# Patient Record
Sex: Female | Born: 1970 | Race: White | Marital: Married | Smoking: Former smoker
Health system: Northeastern US, Academic
[De-identification: ages and names within clinical notes are randomized; demographics above are authoritative.]

## PROBLEM LIST (undated history)

## (undated) DIAGNOSIS — F419 Anxiety disorder, unspecified: Secondary | ICD-10-CM

## (undated) DIAGNOSIS — F32A Depression, unspecified: Secondary | ICD-10-CM

## (undated) DIAGNOSIS — U071 COVID-19: Secondary | ICD-10-CM

## (undated) DIAGNOSIS — E119 Type 2 diabetes mellitus without complications: Secondary | ICD-10-CM

## (undated) DIAGNOSIS — G43909 Migraine, unspecified, not intractable, without status migrainosus: Secondary | ICD-10-CM

## (undated) HISTORY — DX: Depression, unspecified: F32.A

## (undated) HISTORY — DX: Anxiety disorder, unspecified: F41.9

## (undated) HISTORY — DX: Migraine, unspecified, not intractable, without status migrainosus: G43.909

## (undated) HISTORY — DX: COVID-19: U07.1

## (undated) HISTORY — PX: CARPAL TUNNEL RELEASE: SHX101

---

## 2001-10-22 ENCOUNTER — Encounter: Payer: Self-pay | Admitting: Gastroenterology

## 2007-10-12 ENCOUNTER — Encounter: Payer: Self-pay | Admitting: Gastroenterology

## 2009-01-09 DIAGNOSIS — Z331 Pregnant state, incidental: Secondary | ICD-10-CM | POA: Insufficient documentation

## 2009-10-21 ENCOUNTER — Ambulatory Visit: Payer: Self-pay | Admitting: Otolaryngology

## 2009-10-26 DIAGNOSIS — J342 Deviated nasal septum: Secondary | ICD-10-CM | POA: Insufficient documentation

## 2009-10-26 DIAGNOSIS — D1039 Benign neoplasm of other parts of mouth: Secondary | ICD-10-CM | POA: Insufficient documentation

## 2009-10-26 DIAGNOSIS — J343 Hypertrophy of nasal turbinates: Secondary | ICD-10-CM | POA: Insufficient documentation

## 2009-11-02 ENCOUNTER — Ambulatory Visit
Admit: 2009-11-02 | Discharge: 2009-11-02 | Disposition: A | Discharge status: Home / Self Care | Payer: Self-pay | Source: Ambulatory Visit | Attending: Otolaryngology | Admitting: Otolaryngology

## 2009-11-03 ENCOUNTER — Ambulatory Visit: Payer: Self-pay | Admitting: Otolaryngology

## 2009-11-04 LAB — SURGICAL PATHOLOGY

## 2009-11-09 ENCOUNTER — Ambulatory Visit: Payer: Self-pay | Admitting: Otolaryngology

## 2009-11-16 NOTE — Op Note (Signed)
SURGEON:  Emilio Aspen, MD  CO-SURGEON:  ASSISTANTHyman Bower, MD,RES  SURGERY DATE:  11/02/2009    PREOPERATIVE DIAGNOSIS:   1.    Deviated nasal septum and inferior  turbinate hypertrophy with obstructed nasal         airway.        2.    Uvular lesion.    POSTOPERATIVE DIAGNOSIS:  1.    Deviated nasal septum and inferior  turbinate hypertrophy with obstructed nasal         airway.        2.    Uvular lesion.    OPERATIVE PROCEDURE:      Septoplasty with inferior turbinate submucosal  resection and excision of uvular lesion.    ANESTHESIA: General.    ESTIMATED BLOOD LOSS:     150 cc.    DESCRIPTION OF PROCEDURE:              The patient was brought to the  operating room and induced using a general anesthetic.  After a surgical  timeout, neurosurgical pledgets soaked in 4% cocaine solution were inserted  into the nasal cavity for vasoconstriction and anesthesia.    A total of 9  cc of 1% lidocaine with epinephrine were then injected into the floor of  the nose, the columella, and the nasal septum.  The patient was draped in  the usual fashion for nasal surgery.  Nasal cavities were examined using  the nasal speculum.  The deformities of the septum were noted to be a  concavity on the right and convexity on the left that was obstructing the  cavity as well as inferior deflection off of the maxillary crest.    The  columellar retractor was placed and the anterior caudal aspect of the nasal  septal cartilage was delivered.   A 15-blade was used to make an incision  down to the cartilage while the alar retractor was used to protect the ala.  The proper plane was identified using the caudal knife and the  submucoperichondrial flap was raised on the left septum using the Market researcher.   A small linear rent was made high in the septum about halfway  back.   A vertical incision through the cartilage was made anterior to the  deflection and a submucoperichondrial flap was raised then on the patient's  right  side.   The deviated cartilage was then removed using open-biting  rongeurs and an osteotome was used to remove obstructing cartilage high  along the septum as well as along the inferior strip.   Nasal cavity was  then examined and found to be less obstructed than previously.   Attention  was turned to the hemitransfixion incision.  This was closed using 3-0  chromic suture and a quilting stitch was placed through the septal mucosa  and a small rent on the right inferiorly served as a drainage port.  Attention then turned to the inferior turbinates.  The turbinates were  injected with a total of 5 cc of 1% lidocaine with epinephrine.  A 15-blade  was used to make an incision down to bone along the inferior edge of the  turbinates and a caudal elevator was used to elevate a medial mucosal flap.  The open-biting rongeur was used to remove turbinate bone and lateral  mucosa.  The turbinates were also outfractured.   Bleeding was addressed  using the suction cautery.   Nasal splints were then placed along the  septum.  The Silastic splints were affixed with nylon suture.  Given the  bleeding from the inferior turbinates, Telfa rolls were inserted as nasal  packing as well as some Surgicel.  Attention then turned to the oral  cavity.   The McIvor mouth gag was inserted and the uvula was easily  identified.  The papillomatous lesion was grasped using forceps and  resected using Metzenbaum scissors .  This was sent to pathology.  There  was no bleeding from the excisional biopsy site and the mouth gag was  removed.  The patient's oropharynx and hypopharynx were suctioned and the  procedure was terminated.  Care was returned to the Anesthesia team and the  patient was extubated and brought to the PACU in good condition.   There  were no complications with the surgery.  Dr. Anne Shutter was present and active  through the entire case.    Dictated by:  Hyman Bower, MD,RES    I was present throughout the entire  procedure.    Electronically Signed and Finalized  by  Emilio Aspen, MD 11/16/2009 14:57  _____________________________________________  Emilio Aspen, MD      DD:   11/02/2009  DT:   11/02/2009  9:29 P  DVI:  [id number]  [id number]    cc:   Emilio Aspen, MD

## 2009-11-24 ENCOUNTER — Ambulatory Visit: Payer: Self-pay | Admitting: Otolaryngology

## 2010-07-06 ENCOUNTER — Ambulatory Visit
Admit: 2010-07-06 | Discharge: 2010-07-06 | Disposition: A | Discharge status: Home / Self Care | Payer: Self-pay | Source: Ambulatory Visit | Attending: Specialist | Admitting: Specialist

## 2010-07-13 ENCOUNTER — Encounter: Payer: Self-pay | Admitting: Gastroenterology

## 2010-07-19 ENCOUNTER — Ambulatory Visit
Admit: 2010-07-19 | Discharge: 2010-07-19 | Disposition: A | Discharge status: Home / Self Care | Payer: Self-pay | Source: Ambulatory Visit | Attending: Specialist | Admitting: Specialist

## 2010-07-19 LAB — LIPID PANEL
Chol/HDL Ratio: 4.8
Cholesterol: 186 mg/dL
HDL: 39 mg/dL
LDL Calculated: 133 mg/dL
Non HDL Cholesterol: 147 mg/dL
Triglycerides: 72 mg/dL

## 2010-07-19 LAB — GGT: GGT: 12 U/L (ref 5–36)

## 2010-07-19 LAB — COMPREHENSIVE METABOLIC PANEL
ALT: 11 U/L (ref 0–35)
AST: 12 U/L (ref 0–35)
Albumin: 4.3 g/dL (ref 3.5–5.2)
Alk Phos: 57 U/L (ref 35–105)
Anion Gap: 7 (ref 7–16)
Bilirubin,Total: 0.3 mg/dL (ref 0.0–1.2)
CO2: 28 mmol/L (ref 20–28)
Calcium: 9.5 mg/dL (ref 8.8–10.2)
Chloride: 105 mmol/L (ref 96–108)
Creatinine: 0.82 mg/dL (ref 0.51–0.95)
GFR,Black: >59 *
GFR,Caucasian: >59 *
Glucose: 95 mg/dL (ref 74–106)
Lab: 13 mg/dL (ref 6–20)
Potassium: 4.4 mmol/L (ref 3.3–5.1)
Sodium: 140 mmol/L (ref 133–145)
Total Protein: 6.4 g/dL (ref 6.3–7.7)

## 2010-07-19 LAB — CBC AND DIFFERENTIAL
Baso # K/uL: 0 THOU/uL (ref 0.0–0.1)
Basophil %: 0.5 % (ref 0.1–1.2)
Eos # K/uL: 0 THOU/uL (ref 0.0–0.4)
Eosinophil %: 0.5 % — ABNORMAL LOW (ref 0.7–5.8)
Hematocrit: 42 % (ref 34–45)
Hemoglobin: 14.2 g/dL (ref 11.2–15.7)
Lymph # K/uL: 2.7 THOU/uL (ref 1.2–3.7)
Lymphocyte %: 41.6 % (ref 19.3–51.7)
MCV: 92 fL (ref 79–95)
Mono # K/uL: 0.4 THOU/uL (ref 0.2–0.9)
Monocyte %: 5.7 % (ref 4.7–12.5)
Neut # K/uL: 3.3 THOU/uL (ref 1.6–6.1)
Platelets: 179 THOU/uL (ref 160–370)
RBC: 4.5 MIL/uL (ref 3.9–5.2)
RDW: 12.6 % (ref 11.7–14.4)
Seg Neut %: 51.7 % (ref 34.0–71.1)
WBC: 6.5 THOU/uL (ref 4.0–10.0)

## 2010-07-19 LAB — TSH: TSH: 0.94 u[IU]/mL (ref 0.27–4.20)

## 2010-07-19 LAB — T4, FREE: Free T4: 0.9 ng/dL (ref 0.9–1.7)

## 2010-07-20 LAB — HEMOGLOBIN A1C: Hemoglobin A1C: 5.5 % (ref 4.0–6.0)

## 2010-07-20 LAB — METHYLMALONIC ACID, SERUM: Methylmalonic Acid: <0.1 umol/L (ref 0.00–0.40)

## 2010-07-21 LAB — VITAMIN D
25-OH VIT D2: <4 ng/mL
25-OH VIT D3: 23 ng/mL
25-OH Vit Total: 23 ng/mL — ABNORMAL LOW (ref 30–80)

## 2010-09-06 ENCOUNTER — Other Ambulatory Visit: Payer: Self-pay | Admitting: Specialist

## 2010-09-06 ENCOUNTER — Encounter: Payer: Self-pay | Admitting: Gastroenterology

## 2010-09-06 ENCOUNTER — Ambulatory Visit: Payer: Self-pay | Admitting: Obstetrics and Gynecology

## 2010-09-06 DIAGNOSIS — Z1231 Encounter for screening mammogram for malignant neoplasm of breast: Secondary | ICD-10-CM

## 2010-09-08 NOTE — Progress Notes (Signed)
Reason For Visit   Sharon Valdez, a 39 year old G 42 P62 female presents for discussion of   removal of IUD.  HPI   Married one month ago.  Hx of TAB last year and Mirena IUD placement.  Now has joined church with husband and does not feel comfortable with IUD.  Husband is unaware that patient has IUD in and unaware of TAB hx.     Denies domestic violence, controlling behavior.     .  Allergies   Latex-asked/denied  No Known Drug Allergy.  Current Meds   ALPRAZolam 0.5 MG Tablet;take 1/2 or 1 tablet every 4 hours if needed   maximum daily dose of 4 tablets; RPT  Lexapro 10 MG Tablet;take 1 tablet by mouth once daily; RPT  Hydrocodone-Acetaminophen 5-500 MG Tablet;TAKE 1 TABLET EVERY 4 TO 6 HOURS   AS NEEDED FOR PAIN. NO MORE THAN 6 TABLETS PER DAY.; Rx  Fluconazole 150 MG Tablet;take 1 tablet by mouth FOR 1 DOSE. REPEAT AFTER   48 HOURS; RPT.  Assessment   39 yo  desires IUD removal  open to pregnancy - aware of AMA risks.  Plan   IUD easily removed   Discussed advanced maternal age related risks of pregnancy  Discussed details of natural family planning, barrier method, other   hormonal options available  Pt requests that if she does become pregant that GandP hx are not shared   with husband, and IUD use is not shared with husband.  Prenatal vitamins  RV for annual and sooner PRN.  Signature   Electronically signed by: Felipa Emory  CNM; 09/08/2010 7:09 PM EST; Chartered loss adjuster.

## 2010-09-13 ENCOUNTER — Ambulatory Visit
Admit: 2010-09-13 | Discharge: 2010-09-13 | Disposition: A | Discharge status: Home / Self Care | Payer: Self-pay | Source: Ambulatory Visit | Attending: Specialist | Admitting: Specialist

## 2010-09-13 LAB — HM MAMMOGRAPHY

## 2010-09-15 ENCOUNTER — Other Ambulatory Visit: Payer: Self-pay | Admitting: Radiology

## 2010-09-15 DIAGNOSIS — R928 Other abnormal and inconclusive findings on diagnostic imaging of breast: Secondary | ICD-10-CM

## 2010-09-20 ENCOUNTER — Ambulatory Visit
Admit: 2010-09-20 | Discharge: 2010-09-20 | Disposition: A | Discharge status: Home / Self Care | Payer: Self-pay | Source: Ambulatory Visit | Attending: Obstetrics and Gynecology | Admitting: Obstetrics and Gynecology

## 2010-10-04 ENCOUNTER — Ambulatory Visit: Payer: Self-pay | Admitting: Obstetrics and Gynecology

## 2010-10-04 LAB — HM PAP SMEAR

## 2010-10-05 LAB — N. GONORRHOEAE DNA AMPLIFICATION: N. gonorrhoeae DNA Amplification: 0

## 2010-10-05 LAB — CHLAMYDIA PLASMID DNA AMPLIFICATION: Chlamydia Plasmid DNA Amplification: 0

## 2010-10-08 LAB — GYN CYTOLOGY

## 2010-10-08 LAB — HPV DNA PROBE WITH CYTOLOGY: HPV Hybrid Capture: NEGATIVE

## 2010-10-12 ENCOUNTER — Ambulatory Visit: Payer: Self-pay

## 2010-10-12 ENCOUNTER — Other Ambulatory Visit: Payer: Self-pay | Admitting: Obstetrics and Gynecology

## 2010-10-13 ENCOUNTER — Ambulatory Visit
Admit: 2010-10-13 | Discharge: 2010-10-13 | Disposition: A | Discharge status: Home / Self Care | Payer: Self-pay | Source: Ambulatory Visit | Attending: Obstetrics and Gynecology | Admitting: Obstetrics and Gynecology

## 2010-10-13 LAB — TSH: TSH: 1.02 u[IU]/mL (ref 0.27–4.20)

## 2010-10-13 LAB — T4, FREE: Free T4: 0.9 ng/dL (ref 0.9–1.7)

## 2010-10-13 LAB — T3: T3: 118 ng/dL (ref 80–200)

## 2010-10-13 NOTE — Progress Notes (Signed)
 Reason For Visit   Sharon Valdez, a 39 year old G 3 P44 female presents for her annual Gyn   exam.  HPI   Has not had menses since IUD removal on 09/06/10.  Hoping will get this month so can try for pregnancy with new husband.     Reports has gained 18 #s since stopped working out in July.     .  Allergies   Latex-asked/denied  No Known Drug Allergy.  Current Meds   Neomycin-Polymyxin B GU SOLN;; RPT  1-Medication Reconciliation;Done today.; Rx  Lexapro 10 MG Tablet;take 1 tablet by mouth once daily; RPT  ALPRAZolam 0.5 MG Tablet;take 1/2 or 1 tablet every 4 hours if needed   maximum daily dose of 4 tablets; RPT.  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  PSH   Neuroplasty Median Nerve At Carpal Tunnel.  Fatty cyst removed from left flank.  Family Hx   Sororal history of Depression Chronic; and mother  Maternal history of Diabetes Mellitus.  Personal Hx   Alcohol: No   Illicit Drugs: None  Tobacco:   Yes5cigarettes per day       SOCIAL HISTORY  Marital Status: married  Lives with husband and 2 children (1 bio 1 adopted)  Employed: Yes - at [employer] 4 10-11 hr days  History of Domestic Violence No  Feels safe in Current Relationship Yes  Depression Symptoms: Yes         HEALTHY PRACTICES  Self Breast Exam: Yes - recent mamo with [place]  Balanced Diet: Yes - eats late at night  Exercise: No  Vitamin-Supplements Yes  Seat belt: Yes  Gun in the home: No  Safe sex practices: Yes  Health Care Proxy on file: Yes      NUTRITION:   recent 18 # wt gain     FUNCTIONAL/COGNITIVE ABILITY:  --No functional or cognitive limitations.  Barriers to Education   BARRIERS TO EDUCATION                                                                                                                                                                                Assessment of learning needs:  10/04/2010       No barriers to learning      Reads/writes English     .  ROS   CONSTITUTIONAL: Appetite good, no fevers, night sweats or weight loss  EYES: No visual changes, no eye pain  ENT: No hearing difficulties, no ear pain  CV: No chest pain, shortness of breath or peripheral edema  RESPIRATORY: No cough, wheezing or dyspnea  GI: No nausea/vomiting, abdominal pain, or change in bowel habits  GU: No dysuria, urgency or incontinence  MS: No joint pain/swelling or musculoskeletal deformities  SKIN: No rashes  NEURO: No MS changes, no motor weakness, no sensory changes  PSYCH: No depression or anxiety  ENDOCRINE: No polyuria/polydipsia, no heat intolerance  HEME/LYMPH: No easy bleeding/bruising or swollen nodes  ALL/IMMUN: No allergic reactions  .  Vital Signs   Recorded by [facility] on 04 Oct 2010 10:02 AM  BP:122/78,   Height: 62.5 in, Weight: 167 lb, BMI: 30.1 kg/m2,   Pain Scale: 0.  Physical Exam   GENERAL: 39 year old female in NAD.    MENTAL STATUS: Alert, normal MS. Answers all questions appropriately.  NECK: Thyroid without obvious nodules or enlargement. No lymphadenopathy.   SKIN: No rashes, no nevi.  LUNGS: CTA bilaterally, no wheezes with forced expiration, respirations   unlabored.  HEART: Regular rate without murmurs.  BREASTS: Symmetrical bilaterally. No palpable masses or nipple discharge.   No skin changes. No axillary or clavicular adenopathy. Nipples are everted.  ABDOMEN:Soft, non-tender, no HSM, no palpable masses, scars, or lesions.  PELVIC EXAM:  EXT. GENITALIA: Urethral meatus and vulva normal in appearance  INTERNAL GENITALIA: Negative BSU, pink, rugatted, normal appearing discharge  CERVIX: Midline, pink. os closed, no discharge, no CMT, no lesions  VAGINA: Smooth pink walls, no lesions,  no discharge.  UTERUS:  Anteverted,no palpable masses, nontender  ADNEXA: no mass, non tender   RECTAL EXAM: Deferred.  Assessment   39 yo   desires pregnancy  amenorrhea since IUD removed    month ago.  Plan   Lab slip TSH, Free T4, T3  D3  1000 mg daily  continue prenatal vits  Exercise daily for wt loss  Pap with HPV sent  GC/CT sent  RV in 2-3  months if no menses  .  Signature   Electronically signed by: Felipa Emory  CNM; 10/13/2010 7:26 PM EST; Chartered loss adjuster.

## 2010-12-28 NOTE — Miscellaneous (Unsigned)
 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 ; Rx   ALPRAZolam 0.5 MG Tablet; take 1/2 or 1 tablet every 4 hours if needed   maximum daily dose of 4 tablets ; RPT   Lexapro 10 MG Tablet; take 1 tablet by mouth once daily ; RPT   Prenatal Vitamins TABS ; RPT   Vitamin D3 1000 UNIT Tablet; TAKE 1 TABLET DAILY. ; RPT     Immunizations  H1N1 Influenza Inj   Influenza

## 2012-01-01 ENCOUNTER — Encounter: Payer: Self-pay | Admitting: Emergency Medicine

## 2012-01-01 ENCOUNTER — Emergency Department
Admission: EM | Admit: 2012-01-01 | Disposition: A | Discharge status: Home / Self Care | Payer: Self-pay | Source: Ambulatory Visit | Attending: Emergency Medicine | Admitting: Emergency Medicine

## 2012-01-01 MED ORDER — OSELTAMIVIR PHOSPHATE 75 MG PO CAPS *I*
75.0000 mg | ORAL_CAPSULE | Freq: Once | ORAL | Status: AC
Start: 2012-01-01 — End: 2012-01-01
  Administered 2012-01-01: 75 mg via ORAL
  Filled 2012-01-01: qty 1

## 2012-01-01 MED ORDER — OXYCODONE-ACETAMINOPHEN 5-325 MG PO TABS *I*
2.0000 | ORAL_TABLET | Freq: Once | ORAL | Status: AC
Start: 2012-01-01 — End: 2012-01-01

## 2012-01-01 MED ORDER — OXYCODONE-ACETAMINOPHEN 5-325 MG PO TABS *I*
ORAL_TABLET | ORAL | Status: AC
Start: 2012-01-01 — End: 2012-01-01
  Administered 2012-01-01: 2 via ORAL
  Filled 2012-01-01: qty 2

## 2012-01-01 MED ORDER — OSELTAMIVIR PHOSPHATE 75 MG PO CAPS *I*
75.0000 mg | ORAL_CAPSULE | Freq: Two times a day (BID) | ORAL | Status: AC
Start: 2012-01-01 — End: 2012-01-06

## 2012-01-01 MED ORDER — OXYCODONE-ACETAMINOPHEN 5-325 MG PO TABS *I*
1.0000 | ORAL_TABLET | ORAL | Status: AC | PRN
Start: 2012-01-01 — End: 2012-01-06

## 2012-01-01 MED ORDER — BENZONATATE 100 MG PO CAPS *I*
100.0000 mg | ORAL_CAPSULE | Freq: Three times a day (TID) | ORAL | Status: AC | PRN
Start: 2012-01-01 — End: 2012-01-06

## 2013-03-19 ENCOUNTER — Ambulatory Visit: Payer: Self-pay | Admitting: Obstetrics and Gynecology

## 2013-03-19 ENCOUNTER — Encounter: Payer: Self-pay | Admitting: Obstetrics and Gynecology

## 2013-03-19 VITALS — BP 110/70 | Ht 63.5 in | Wt 182.0 lb

## 2013-03-19 DIAGNOSIS — Z01419 Encounter for gynecological examination (general) (routine) without abnormal findings: Secondary | ICD-10-CM

## 2013-03-19 DIAGNOSIS — N951 Menopausal and female climacteric states: Secondary | ICD-10-CM

## 2013-03-19 MED ORDER — ESTRADIOL-NORETHINDRONE ACET 0.05-0.14 MG/DAY TD PTTW *A*
1.0000 | MEDICATED_PATCH | TRANSDERMAL | Status: DC
Start: 2013-03-21 — End: 2013-07-12

## 2013-03-19 NOTE — Progress Notes (Signed)
GYNECOLOGY ANNUAL EXAM    [id number] GYN appointment     HPI: 42 y.o. G71P1001 Caucasian female presents to office today for annual exam.      Reports no menses for 8 months. Believes is menopausal because of daily hot flashes.  Reports gets them 5 x day and at night.  Also general trouble sleeping  Requesting medication to relieve these symptoms.    Has appt with PCP and having blood drawn for thyroid and full physical.    PAST MEDICAL HISTORY  Past Medical History   Diagnosis Date   . Depression    . Anxiety         PAST SURGICAL HISTORY  Past Surgical History   Procedure Laterality Date   . Carpal tunnel release       Neuroplasty Median Nerve At Carpal Tunnel   . Nasal septum surgery     . Fatty cyst  2007     left upper abdomen - skin        HOME MEDICATIONS  Prior to Admission medications    Medication Sig Start Date End Date Taking? Authorizing Provider   naproxen (NAPROSYN) 250 MG tablet Take 250 mg by mouth 2 times daily (with meals)   Yes [provider]   LEXAPRO 10 MG tablet take 1 tablet by mouth once daily 12/05/08  Yes Provider, Conversion   estradiol-norethindrone (COMBIPATCH) 0.05-0.14 MG/DAY Place 1 patch onto the skin twice a week 03/21/13   Emeline Darling, CNM        ALLERGIES  Allergies   Allergen Reactions   . No Known Drug Allergy      Created by Conversion - 0;    . No Known Latex Allergy      Created by Conversion - 0;         OBSTETRIC HISTORY  OB History    Grav Para Term Preterm Abortions TAB SAB Ect Mult Living    1 1 1       1        Obstetric Comments    GYN Hx:  Patient's last menstrual period was 04/18/2012.   Menarche: 42yo, Menses:  monthly    Last pap smear: 10/04/2010, Results: neg, HPV: neg    The patient is sexually active, +monog.     No STD hx          SOCIAL HISTORY  History     Social History   . Marital Status: Married     Spouse Name: Caryn Bee     Number of Children: 2   . Years of Education: N/A     Occupational History   . service coordinator      full time     Social  History Main Topics   . Smoking status: Former Smoker -- 0.50 packs/day     Quit date: 06/18/2012   . Smokeless tobacco: None   . Alcohol Use: No   . Drug Use: None   . Sexually Active: Yes -- Female partner(s)     Other Topics Concern   . None     Social History Narrative    Psychosocial Update        Lives with husband and 2 children    Partner name Caryn Bee, sons are 30 and 28 yo        Domestic violence:No    Abuse (physical, sexual, mental):  no    Depression symptoms:  Yes - takes lexapro  Occupation:  Full time Company secretary     Years of Education: 16        Life stressors:  Working mother...    Family relationships:  supportive        Exercise: 30 min day cardio, [employer] people                       SCREENING HISTORY  Mammogram yes      FAMILY HISTORY  Family History   Problem Relation Age of Onset   . Depression Mother    . Diabetes          REVIEW OF SYSTEMS  CONSTITUTIONAL: Appetite good, no fevers, +night sweats as per HPI  EYES: No visual changes, no eye pain  ENT: No hearing difficulties, no ear pain  CV: No chest pain, shortness of breath or peripheral edema  RESPIRATORY: No cough, wheezing or dyspnea  GI: No nausea/vomiting, abdominal pain, or change in bowel habits  GU: No dysuria, urgency or incontinence  MS: No joint pain/swelling or musculoskeletal deformities  SKIN: No rashes  NEURO: No MS changes, no motor weakness, no sensory changes  PSYCH: No depression or anxiety  ENDOCRINE: No polyuria/polydipsia, no heat intolerance  HEME/LYMPH: No easy bleeding/bruising or swollen nodes  ALL/IMMUN: No allergic reactions        PHYSICAL EXAM  Filed Vitals:    03/19/13 1406   BP: 110/70   Height: 5' 3.5" (1.613 m)   Weight: 182 lb (82.555 kg)     GENERAL: 42 y.o.  year y/o female in NAD.  HEENT: Neck supple, EOMI, thyroid without obvious nodules or enlargement. No lymphadenopathy.   LUNGS: CTA bilaterally, no wheezes with forced expiration, respirations unlabored.  HEART: Regular rate without  murmurs.  BREASTS: No palpable masses or nipple discharge. No skin changes. No axillary or clavicular adenopathy.  ABDOMEN: Soft, non-tender, no HSM, no palpable masses, scars, or lesions.    PELVIC: Normal external female genitalia, labia majora, minora, clitoris. BUS WNL.  Normal appearing cervix. Uterus is midline, anteverted, mobile, and non-tender. There is no CMT. Ovaries are WNL. Adnexa negative for masses.  EXTREMITIES: no edema.  SKIN: No rashes, no evidence of skin breakdown, no suspicious nevi.  NEURO: gait normal  MENTAL STATUS: Alert, normal MS. Answers all questions appropriately.      ASSESSMENT AND PLAN  42 y.o. G1P1001 AGY exam  Perimenopausal - discussed that dx of menopause is made after 12 months without period  Desires HRT for vasomotor sx: Rx combipatch with 2 refills  RV 2 months for follow up    PLAN   GYN SCREENING:   - Pap smear due 2016  Offered and declines:  - GC  - Chlamydia  - HIV  - RPR  - Hepatitis C   - Hepatitis B surface antibody and antigen     PREVENTATIVE HEALTH:  - Self breast awareness   - Calcium and Vitamin D3  - Multivitamins   - Mammogram yearly    CONTRACEPTION REVIEWED: Discussed that pregnancy is still possible, not probable  - Risk and prevention of STD reviewed.   - Contraceptive plan and follow-up discussed and understood by patient.     NUTRITION REVIEWED:   - Weight Loss Counseling  - Weight Management Strategies     EXERCISE:   - Counseled on regular exercise.    - Discussed duration, frequency, intensity, and types of exercise.    SAFER SEX PRACTICES:    -  Reviewed    RV 2 months and PRN  Warnings about risks with HRT  Handout on HRT given to patient.

## 2013-03-25 LAB — HM MAMMOGRAPHY: HM MAMMO: NEGATIVE

## 2013-05-20 ENCOUNTER — Ambulatory Visit: Payer: Self-pay | Admitting: Obstetrics and Gynecology

## 2013-05-27 LAB — HM PAP SMEAR: HM PAP: NORMAL

## 2013-06-11 ENCOUNTER — Ambulatory Visit: Payer: Self-pay | Admitting: Obstetrics and Gynecology

## 2013-07-02 ENCOUNTER — Other Ambulatory Visit: Payer: Self-pay | Admitting: Primary Care

## 2013-07-02 NOTE — Telephone Encounter (Signed)
SHE IS OUT OF MEDICINE. PLEASE REFILL TODAY.

## 2013-07-08 ENCOUNTER — Encounter: Payer: Self-pay | Admitting: Primary Care

## 2013-07-08 DIAGNOSIS — Z9889 Other specified postprocedural states: Secondary | ICD-10-CM | POA: Insufficient documentation

## 2013-07-08 DIAGNOSIS — F418 Other specified anxiety disorders: Secondary | ICD-10-CM | POA: Insufficient documentation

## 2013-07-09 ENCOUNTER — Encounter: Payer: Self-pay | Admitting: Primary Care

## 2013-07-09 ENCOUNTER — Ambulatory Visit: Payer: Self-pay | Admitting: Primary Care

## 2013-07-09 VITALS — BP 124/90 | HR 76 | Ht 62.5 in | Wt 185.0 lb

## 2013-07-09 MED ORDER — ESCITALOPRAM OXALATE 5 MG PO TABS *I*
5.0000 mg | ORAL_TABLET | Freq: Every day | ORAL | Status: DC
Start: 2013-07-09 — End: 2014-01-09

## 2013-07-09 MED ORDER — LEXAPRO 10 MG PO TABS
ORAL_TABLET | ORAL | Status: DC
Start: 2013-07-02 — End: 2013-08-13

## 2013-07-09 NOTE — Patient Instructions (Signed)
Can take Aleve twice daily with food for 7-10 days. Avoid straining right hip.  Bloodwork is ordered--please fast prior to obtaining at lab.

## 2013-07-09 NOTE — Progress Notes (Signed)
Patient ID: MILICA MOHRBACHER is a 42 y.o. year old female.    Chief Complaint   Patient presents with   . Medication Management     HPI:  Kathlene November is here to followup on history of depression and anxiety.  She is in need of refills on her medications.  Overall doing very well.  She did make an attempt at discontinuing her medication but found that she does in fact require at least a small dose of Lexapro.  She is currently cutting the Lexapro in half.  And this appears quite adequate in controlling in her symptoms.  She is also complaining of chronic fatigue.  Says she sleeps well.  No new drugs, alcohol.    Allergy / Social History / Medications:  Allergies   Allergen Reactions   . No Known Drug Allergy      Created by Conversion - 0;    . No Known Latex Allergy      Created by Conversion - 0;      History   Substance Use Topics   . Smoking status: Former Smoker -- 0.50 packs/day     Quit date: 06/18/2012   . Smokeless tobacco: Not on file   . Alcohol Use: No     Patient's Medications   New Prescriptions    No medications on file   Previous Medications    DIPHENHYDRAMINE (BENADRYL) 25 MG TABLET    Take 50 mg by mouth nightly    ESTRADIOL-NORETHINDRONE (COMBIPATCH) 0.05-0.14 MG/DAY    Place 1 patch onto the skin twice a week   Modified Medications    Modified Medication Previous Medication    ESCITALOPRAM (LEXAPRO) 5 MG TABLET LEXAPRO 10 MG tablet       Take 1 tablet (5 mg total) by mouth daily    take 1 tablet by mouth once daily    LEXAPRO 10 MG TABLET LEXAPRO 10 MG tablet       take 1 tablet by mouth once daily    take 1 tablet by mouth once daily   Discontinued Medications    ESCITALOPRAM (LEXAPRO) 10 MG TABLET    Take 5 mg by mouth    NAPROXEN (NAPROSYN) 250 MG TABLET    Take 250 mg by mouth 2 times daily (with meals)          Physical Exam:  Filed Vitals:    07/09/13 1128   BP: 124/90   Pulse: 76   Height: 1.588 m (5' 2.5")   Weight: 83.915 kg (185 lb)     SpO2 Readings from Last 3 Encounters:   01/01/12 99%    11/03/09 95%   10/21/09 98%     Estimated body mass index is 33.28 kg/(m^2) as calculated from the following:    Height as of this encounter: 1.588 m (5' 2.5").    Weight as of this encounter: 83.915 kg (185 lb).    Chest: Clear to auscultation.  Cardiac: Regular rate and rhythm no rubs murmurs or gallops.  Extremities: No edema     Recent Lab Results:  Lab Results   Component Value Date    NA 140 07/19/2010    K 4.4 07/19/2010    CL 105 07/19/2010    CO2 28 07/19/2010    UN 13 07/19/2010    CREAT 0.82 07/19/2010    VID25 23* 07/19/2010    WBC 6.5 07/19/2010    HGB 14.2 07/19/2010    HCT 42 07/19/2010    PLT 179  07/19/2010    TSH 1.02 10/13/2010    CHOL 186 07/19/2010    TRIG 72 07/19/2010    HDL 39 07/19/2010    LDLC 133 07/19/2010    CHHDC 4.8 07/19/2010     Hemoglobin A1C   Date Value Range Status   07/19/2010 5.5  4.0-6.0 % Final                                    NON-DIABETIC          Assessment and Plan:     1.Depression/anxiety.  Stable on current medication will have her continue the same and have her followup in 6-12 months.   2.Fatigue.  We will order lab work listed below in anticipation of a complete physical exam.          Orders this visit  Orders Placed This Encounter   Procedures   . Lipid panel   . Comprehensive metabolic panel   . CBC and differential   . Folate   . Hemoglobin A1c   . TSH   . T4, free   . Vitamin B12   . Vitamin d 25 hydroxy, d2&d3

## 2013-07-12 ENCOUNTER — Other Ambulatory Visit: Payer: Self-pay | Admitting: Obstetrics and Gynecology

## 2013-07-12 NOTE — Telephone Encounter (Signed)
edi-script request for combipatch.  Pt has an appt 8/20.

## 2013-08-05 ENCOUNTER — Ambulatory Visit
Admit: 2013-08-05 | Discharge: 2013-08-05 | Disposition: A | Discharge status: Home / Self Care | Payer: Self-pay | Source: Ambulatory Visit | Attending: Primary Care | Admitting: Primary Care

## 2013-08-05 DIAGNOSIS — F418 Other specified anxiety disorders: Secondary | ICD-10-CM

## 2013-08-05 DIAGNOSIS — R5383 Other fatigue: Secondary | ICD-10-CM

## 2013-08-05 LAB — CBC AND DIFFERENTIAL
Baso # K/uL: 0 10*3/uL (ref 0.0–0.1)
Basophil %: 0.3 % (ref 0.1–1.2)
Eos # K/uL: 0 10*3/uL (ref 0.0–0.4)
Eosinophil %: 0.6 % — ABNORMAL LOW (ref 0.7–5.8)
Hematocrit: 40 % (ref 34–45)
Hemoglobin: 13.5 g/dL (ref 11.2–15.7)
Lymph # K/uL: 2.9 10*3/uL (ref 1.2–3.7)
Lymphocyte %: 42.8 % (ref 19.3–51.7)
MCV: 90 fL (ref 79–95)
Mono # K/uL: 0.4 10*3/uL (ref 0.2–0.9)
Monocyte %: 6.4 % (ref 4.7–12.5)
Neut # K/uL: 3.4 10*3/uL (ref 1.6–6.1)
Platelets: 190 10*3/uL (ref 160–370)
RBC: 4.4 MIL/uL (ref 3.9–5.2)
RDW: 12.8 % (ref 11.7–14.4)
Seg Neut %: 49.9 % (ref 34.0–71.1)
WBC: 6.8 10*3/uL (ref 4.0–10.0)

## 2013-08-05 LAB — T4, FREE: Free T4: 0.7 ng/dL — ABNORMAL LOW (ref 0.9–1.7)

## 2013-08-05 LAB — COMPREHENSIVE METABOLIC PANEL
ALT: 21 U/L (ref 0–35)
AST: 19 U/L (ref 0–35)
Albumin: 4.2 g/dL (ref 3.5–5.2)
Alk Phos: 82 U/L (ref 35–105)
Anion Gap: 13 (ref 7–16)
Bilirubin,Total: 0.3 mg/dL (ref 0.0–1.2)
CO2: 25 mmol/L (ref 20–28)
Calcium: 9 mg/dL (ref 8.8–10.2)
Chloride: 104 mmol/L (ref 96–108)
Creatinine: 0.88 mg/dL (ref 0.51–0.95)
GFR,Black: 92 *
GFR,Caucasian: 80 *
Glucose: 103 mg/dL — ABNORMAL HIGH (ref 60–99)
Lab: 19 mg/dL (ref 6–20)
Potassium: 4.6 mmol/L (ref 3.3–5.1)
Sodium: 142 mmol/L (ref 133–145)
Total Protein: 6.4 g/dL (ref 6.3–7.7)

## 2013-08-05 LAB — LIPID PANEL
Chol/HDL Ratio: 5.1
Cholesterol: 229 mg/dL — AB
HDL: 45 mg/dL
LDL Calculated: 162 mg/dL
Non HDL Cholesterol: 184 mg/dL
Triglycerides: 110 mg/dL

## 2013-08-05 LAB — VITAMIN B12: Vitamin B12: 240 pg/mL (ref 211–946)

## 2013-08-05 LAB — HEMOGLOBIN A1C: Hemoglobin A1C: 5.5 % (ref 4.0–6.0)

## 2013-08-05 LAB — TSH: TSH: 2.45 u[IU]/mL (ref 0.27–4.20)

## 2013-08-05 LAB — FOLATE: Folate: 8.9 ng/mL (ref >=4.6)

## 2013-08-06 ENCOUNTER — Encounter: Payer: Self-pay | Admitting: Primary Care

## 2013-08-07 ENCOUNTER — Ambulatory Visit: Payer: Self-pay | Admitting: Obstetrics and Gynecology

## 2013-08-07 ENCOUNTER — Encounter: Payer: Self-pay | Admitting: Obstetrics and Gynecology

## 2013-08-07 ENCOUNTER — Ambulatory Visit: Payer: Self-pay | Admitting: Primary Care

## 2013-08-07 ENCOUNTER — Telehealth: Payer: Self-pay | Admitting: Primary Care

## 2013-08-07 VITALS — BP 114/78 | Ht 62.5 in | Wt 189.0 lb

## 2013-08-07 DIAGNOSIS — N951 Menopausal and female climacteric states: Secondary | ICD-10-CM

## 2013-08-07 DIAGNOSIS — Z79890 Hormone replacement therapy (postmenopausal): Secondary | ICD-10-CM

## 2013-08-07 LAB — VITAMIN D
25-OH VIT D2: <4 ng/mL
25-OH VIT D3: 21 ng/mL
25-OH Vit Total: 21 ng/mL — ABNORMAL LOW (ref 30–60)

## 2013-08-07 MED ORDER — ESTRADIOL-NORETHINDRONE ACET 0.05-0.14 MG/DAY TD PTTW *A*
1.0000 | MEDICATED_PATCH | TRANSDERMAL | Status: AC
Start: 2013-08-08 — End: ?

## 2013-08-07 NOTE — Telephone Encounter (Signed)
Message copied by Randall An on Wed Aug 07, 2013  4:47 PM  ------       Message from: Drue Second       Created: Wed Aug 07, 2013 12:13 PM         Please call patient. LDL cholesterol is elevated.She'll likely need medication. Please ask her to come in to discuss. B12 and Vit D also both low.  ------

## 2013-08-07 NOTE — Telephone Encounter (Signed)
Pt out of town.  She has appt 8/26

## 2013-08-07 NOTE — Telephone Encounter (Signed)
Message copied by Randall An on Wed Aug 07, 2013  4:57 PM  ------       Message from: Drue Second       Created: Wed Aug 07, 2013 12:13 PM         Please call patient. LDL cholesterol is elevated.She'll likely need medication. Please ask her to come in to discuss. B12 and Vit D also both low.  ------

## 2013-08-07 NOTE — Patient Instructions (Signed)
Vit D 3 1000 international unit daily    And Vitamin B12 supplementation

## 2013-08-07 NOTE — Progress Notes (Signed)
Sharon Valdez is a 42 y.o. female here for follow up after starting combipatch for perimenopausal sx.      HPI: LMP was June 2013  Reports no bleeding since.  Has been using combipatch - loves it - has helped with hot flashes.     Has continued gaining weight gain - doing "[employer]" on line diet and exercise program    PCP ordered blood work but pt was unable to be seen today because was late for appt.   Pt has rescheduled.     Gynecologic History:  Patient's last menstrual period was 04/18/2012.       OB History    Grav Para Term Preterm Abortions TAB SAB Ect Mult Living    1 1 1       1        Obstetric Comments    GYN Hx:  Patient's last menstrual period was 04/18/2012.   Menarche: 42yo, Menses:  monthly    Last pap smear: 10/04/2010, Results: neg, HPV: neg    The patient is sexually active, +monog.     No STD hx          Patient Active Problem List   Diagnosis Code   . Pregnancy V22.2   . Deviated Nasal Septum (Acquired) 470   . Hypertrophied Nasal Turbinate 478.0   . 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 S/P carpal tunnel release V45.89   . H/O removal of cyst V15.29   . S/P nasal septoplasty V45.89   . Depression with anxiety 300.4       Allergies   Allergen Reactions   . No Known Drug Allergy      Created by Conversion - 0;    . No Known Latex Allergy      Created by Conversion - 0;        Current Outpatient Prescriptions   Medication   . [START ON 08/08/2013] estradiol-norethindrone (COMBIPATCH) 0.05-0.14 MG/DAY   . LEXAPRO 10 MG tablet   . diphenhydrAMINE (BENADRYL) 25 MG tablet   . escitalopram (LEXAPRO) 5 MG tablet     No current facility-administered medications for this visit.       ROS:  CONSTITUTIONAL: Appetite good, no fevers, night sweats or weight loss  EYES: No visual changes, no eye pain  ENT: No hearing difficulties, no ear pain  CV: No chest pain, shortness of breath or peripheral edema  RESPIRATORY: No cough, wheezing or dyspnea  GI: No nausea/vomiting, abdominal pain, or change in bowel  habits  GU: No dysuria, urgency or incontinence  MS: No joint pain/swelling or musculoskeletal deformities  SKIN: No rashes  NEURO: No MS changes, no motor weakness, no sensory changes  PSYCH: No depression or anxiety  ENDOCRINE: No polyuria/polydipsia, no heat intolerance  HEME/LYMPH: No easy bleeding/bruising or swollen nodes  ALL/IMMUN: No allergic reactions    Objective:  BP 114/78  Ht 1.588 m (5' 2.5")  Wt 85.73 kg (189 lb)  BMI 34 kg/m2  LMP 04/18/2012     Objective:   GENERAL:   42 y.o.  year old female in NAD.    MENTAL STATUS: Alert, normal MS. Answers all questions appropriately.      Assessment & Plan:  42 yo G1P1 and one adopted child  + menopausal status - doing well with combipatch  Rx renewed for combipatch  Follow up with PCP for labs, cholesterol, Thyroid  RV annual and PRN    Time spent with patient: 20 minutes, > 50%  of time spent counseling

## 2013-08-13 ENCOUNTER — Ambulatory Visit: Payer: Self-pay | Admitting: Primary Care

## 2013-08-13 VITALS — BP 108/76 | HR 76 | Ht 62.0 in | Wt 190.0 lb

## 2013-08-13 DIAGNOSIS — E538 Deficiency of other specified B group vitamins: Secondary | ICD-10-CM

## 2013-08-13 DIAGNOSIS — E785 Hyperlipidemia, unspecified: Secondary | ICD-10-CM

## 2013-08-13 DIAGNOSIS — E559 Vitamin D deficiency, unspecified: Secondary | ICD-10-CM

## 2013-08-15 NOTE — Progress Notes (Signed)
Subjective: Sharon Valdez is a 42 y.o. female here to followup on hyperlipidemia and depression.  Patient did have lab work done on 8/18 which revealed total cholesterol 229, triglycerides 110, HDL 45 and LDL 162.  Her vitamin B12 and vitamin D were also noted to be low.    With regard to her depression, she's been quite stable feels well and denies side effects.  There's been no suicidal ideation.  There is mild anxiety but this appears to be mostly well controlled.        Current Outpatient Prescriptions   Medication Sig   . cholecalciferol (VITAMIN D) 1000 UNIT tablet Take 2,000 Units by mouth daily   . cyanocobalamin 250 MCG tablet Take 250 mcg by mouth daily   . estradiol-norethindrone (COMBIPATCH) 0.05-0.14 MG/DAY Place 1 patch onto the skin twice a week   . diphenhydrAMINE (BENADRYL) 25 MG tablet Take 50 mg by mouth nightly   . escitalopram (LEXAPRO) 5 MG tablet Take 1 tablet (5 mg total) by mouth daily       Allergies   Allergen Reactions   . No Known Drug Allergy      Created by Conversion - 0;    . No Known Latex Allergy      Created by Conversion - 0;      Patient Active Problem List   Diagnosis Code   . Pregnancy V22.2   . Deviated Nasal Septum (Acquired) 470   . Hypertrophied Nasal Turbinate 478.0   . 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 S/P carpal tunnel release V45.89   . H/O removal of cyst V15.29   . S/P nasal septoplasty V45.89   . Depression with anxiety 300.4     Past Medical History   Diagnosis Date   . Depression    . Anxiety      Past Surgical History   Procedure Laterality Date   . Carpal tunnel release       Neuroplasty Median Nerve At Carpal Tunnel   . Nasal septum surgery     . Fatty cyst  2007     left upper abdomen - skin      History   Substance Use Topics   . Smoking status: Former Smoker -- 0.50 packs/day     Quit date: 06/18/2012   . Smokeless tobacco: Not on file   . Alcohol Use: No         Medications and allergies were reviewed and reconciled with the patient at this visit and changes were  made.    Objective     Filed Vitals:    08/13/13 1545   BP: 108/76   Pulse: 76   Height: 1.575 m (5\' 2" )   Weight: 86.183 kg (190 lb)     Body mass index is 34.74 kg/(m^2).  Chest: Clear  Cardiac: Regular rate and rhythm  Extremities: No edema        Recent Lab Results     Lab Results   Component Value Date    VID25 21* 08/05/2013    WBC 6.8 08/05/2013    HGB 13.5 08/05/2013    HCT 40 08/05/2013    PLT 190 08/05/2013    TSH 2.45 08/05/2013    HA1C 5.5 08/05/2013    CHOL 229* 08/05/2013    TRIG 110 08/05/2013    HDL 45 08/05/2013    LDLC 162 08/05/2013    CHHDC 5.1 08/05/2013         Lab results: 08/05/13  1610  Sodium 142   Potassium 4.6   Chloride 104   CO2 25   UN 19   Creatinine 0.88   GFR,Caucasian 80   GFR,Black 92   Glucose 103*   Calcium 9.0   Total Protein 6.4   Albumin 4.2   ALT 21   AST 19   Alk Phos 82   Bilirubin,Total 0.3          Assessment and Plan     1.  Hyperlipidemia.  Appears to be likely candidate for lipid lowering agent but patient does not wish to take medication at this point.  She will try diet and exercise and we will refer her to for nutrition consult.  We will repeat her fasting lipid profile in 3-4 months.  2.  Vitamin B12 deficiency.  Begin over-the-counter vitamin B12 1000 mcg by mouth daily we will recheck her vitamin B12 level in one month.  3.  Vitamin D. deficiency.  She can begin over-the-counter vitamin D 3 1000 units by mouth daily and repeat her vitamin D level in 4-6 weeks.  4.  Depression.  Stable on current regimen continue the same, followup when necessary      Discussed natural and expected course of diagnosis and need to alert me if symptoms worsen or do not follow expected course.  Medication teaching done, ADR's discussed.  Notes dictated using Dragon Naturally Speaking v.10 /dictation finalized without routine proofreading and minor transcription errors may occur

## 2014-01-09 ENCOUNTER — Other Ambulatory Visit: Payer: Self-pay | Admitting: Primary Care

## 2014-01-09 DIAGNOSIS — F418 Other specified anxiety disorders: Secondary | ICD-10-CM

## 2014-01-09 MED ORDER — ESCITALOPRAM OXALATE 5 MG PO TABS *I*
5.0000 mg | ORAL_TABLET | Freq: Every day | ORAL | Status: DC
Start: 2014-01-09 — End: 2014-05-02

## 2014-01-24 ENCOUNTER — Telehealth: Payer: Self-pay | Admitting: Primary Care

## 2014-01-24 NOTE — Telephone Encounter (Signed)
PT NEEDS A SCRIPT FOR XANAX-GENERIC. NOT ON MED LIST.CALL PT TO ADVISE.

## 2014-01-24 NOTE — Telephone Encounter (Signed)
Pt requesting Xanax-not able to reach pt by phone

## 2014-01-27 NOTE — Telephone Encounter (Signed)
Able to reach pt by phone-states she has been having increased anxiety lately and having 1-2 panic attacks daily-has had a death in the family recently-states has been off Xanax 6 months but feels she needs to take it now

## 2014-01-27 NOTE — Telephone Encounter (Signed)
Please ask patient for what purpose and for how long she might need it.  Is this for flight anxiety?

## 2014-01-28 ENCOUNTER — Other Ambulatory Visit: Payer: Self-pay | Admitting: Primary Care

## 2014-01-28 MED ORDER — ALPRAZOLAM 0.25 MG PO TABS *I*
0.2500 mg | ORAL_TABLET | Freq: Three times a day (TID) | ORAL | Status: AC | PRN
Start: 2014-01-28 — End: ?

## 2014-01-28 NOTE — Telephone Encounter (Signed)
Okay to use Xanax 0.25 mg by mouth 3 times a day when necessary.  If a five-day call in, then we can prescribe only #15, no refills.  If to pick up, I would not give her more than #20. Please call patient to let her.

## 2014-01-28 NOTE — Telephone Encounter (Signed)
Called pt-will pick up script tomorrow

## 2014-05-02 ENCOUNTER — Other Ambulatory Visit: Payer: Self-pay | Admitting: Primary Care

## 2014-05-05 ENCOUNTER — Other Ambulatory Visit: Payer: Self-pay | Admitting: Primary Care

## 2014-05-05 MED ORDER — ESCITALOPRAM OXALATE 5 MG PO TABS *I*
5.0000 mg | ORAL_TABLET | Freq: Every day | ORAL | Status: AC
Start: 2014-05-05 — End: ?

## 2015-09-11 ENCOUNTER — Encounter: Payer: Self-pay | Admitting: Nurse Practitioner

## 2015-09-11 ENCOUNTER — Ambulatory Visit (INDEPENDENT_AMBULATORY_CARE_PROVIDER_SITE_OTHER): Payer: BLUE CROSS/BLUE SHIELD | Admitting: Nurse Practitioner

## 2015-09-11 VITALS — BP 116/80 | HR 91 | Temp 98.3°F | Resp 16 | Ht 63.25 in | Wt 191.2 lb

## 2015-09-11 DIAGNOSIS — Z7189 Other specified counseling: Secondary | ICD-10-CM | POA: Diagnosis not present

## 2015-09-11 DIAGNOSIS — M25561 Pain in right knee: Secondary | ICD-10-CM | POA: Diagnosis not present

## 2015-09-11 DIAGNOSIS — G4700 Insomnia, unspecified: Secondary | ICD-10-CM | POA: Diagnosis not present

## 2015-09-11 DIAGNOSIS — F411 Generalized anxiety disorder: Secondary | ICD-10-CM | POA: Diagnosis not present

## 2015-09-11 DIAGNOSIS — Z7689 Persons encountering health services in other specified circumstances: Secondary | ICD-10-CM | POA: Insufficient documentation

## 2015-09-11 MED ORDER — ESCITALOPRAM OXALATE 5 MG PO TABS: 5.0000 mg | ORAL_TABLET | Freq: Every day | ORAL | Status: DC

## 2015-09-11 MED ORDER — BUSPIRONE HCL 7.5 MG PO TABS
7.5000 mg | ORAL_TABLET | Freq: Two times a day (BID) | ORAL | Status: DC
Start: 2015-09-11 — End: 2015-11-05

## 2015-09-11 NOTE — Patient Instructions (Addendum)
Follow up in 1 month for annual physical.   Welcome to [facility]! Nice to meet you.

## 2015-09-11 NOTE — Assessment & Plan Note (Signed)
3 hours of sleep at night. Affecting quality of life. Pt used to take xanax at night. Will Start back on lexapro and add buspar for anxiety. Will follow up next month .

## 2015-09-11 NOTE — Assessment & Plan Note (Signed)
Uncontrolled. Pt is visibly very anxious and unable to sit still. Will start on Lexapro again and add buspar. Will follow up next month.

## 2015-09-11 NOTE — Progress Notes (Signed)
Pre visit review using our clinic review tool, if applicable. No additional management support is needed unless otherwise documented below in the visit note. 

## 2015-09-11 NOTE — Assessment & Plan Note (Signed)
Due to sitting during the day. Improved with stretching. Will follow.

## 2015-09-11 NOTE — Progress Notes (Signed)
Patient ID: Jodi Hawkins, female    DOB: [birth year]  Age: 44 y.o. MRN: [id number]  CC: Establish Care   HPI [name] presents for establishing care and CC of anxiety,   1) New pt info:   Immunizations- unknown tdap, will get flu shot today   Mammogram- 2014, normal    Pap- 2014 normal, due in 2017   Eye Exam- 2016, glasses  Dental Exam- UTD  LMP- 2012, post-menopausal   2) Chronic Problems- Acute on chronic concerns see below  3) Acute Problems-   Insomnia- averaging 3 hours at night x 1 years, xanax in past helpful at night, melatonin and tylenol pm does not work   Anxiety- Lexapro, sensitive to noise and light , depression runs in the family, cries easily   Right knee bothering her x 8 months- stretches it out and it is helpful after sitting   History Jodi Hawkins has a past medical history of Anxiety and Migraines.   She has no past surgical history on file.   Her family history includes Hypertension in her mother, paternal grandfather, and paternal grandmother.She reports that she quit smoking about 3 years ago. She has never used smokeless tobacco. She reports that she does not drink alcohol or use illicit drugs.  No outpatient prescriptions prior to visit.   No facility-administered medications prior to visit.   ROS Review of Systems  Constitutional: Negative for fever, chills, diaphoresis and fatigue.  Respiratory: Negative for chest tightness, shortness of breath and wheezing.   Cardiovascular: Negative for chest pain, palpitations and leg swelling.  Gastrointestinal: Negative for nausea, vomiting and diarrhea.  Musculoskeletal: Positive for arthralgias.       Knee pain  Skin: Negative for rash.  Neurological: Negative for dizziness, weakness, numbness and headaches.  Psychiatric/Behavioral: Positive for sleep disturbance. Negative for suicidal ideas. The patient is nervous/anxious.     Objective:  BP 116/80 mmHg  Pulse 91  Temp(Src) 98.3 F (36.8 C)  Resp  16  Ht 5' 3.25" (1.607 m)  Wt 191 lb 3.2 oz (86.728 kg)  BMI 33.58 kg/m2  SpO2 97%  Physical Exam  Constitutional: She is oriented to person, place, and time. She appears well-developed and well-nourished. No distress.  HENT:  Head: Normocephalic and atraumatic.  Right Ear: External ear normal.  Left Ear: External ear normal.  Eyes: Right eye exhibits no discharge. Left eye exhibits no discharge. No scleral icterus.  Cardiovascular: Normal rate, regular rhythm and normal heart sounds.  Exam reveals no gallop and no friction rub.   No murmur heard. Pulmonary/Chest: Effort normal and breath sounds normal. No respiratory distress. She has no wheezes. She has no rales. She exhibits no tenderness.  Neurological: She is alert and oriented to person, place, and time. No cranial nerve deficit. She exhibits normal muscle tone. Coordination normal.  Skin: Skin is warm and dry. No rash noted. She is not diaphoretic.  Psychiatric: She has a normal mood and affect. Her behavior is normal. Judgment and thought content normal.  Very fidgety    Assessment & Plan:   Tamee was seen today for establish care.  Diagnoses and all orders for this visit:  Generalized anxiety disorder  Encounter to establish care  Right knee pain  Insomnia  Other orders -     escitalopram (LEXAPRO) 5 MG tablet; Take 1 tablet (5 mg total) by mouth daily. -     busPIRone (BUSPAR) 7.5 MG tablet; Take 1 tablet (7.5 mg total) by mouth 2 (two) times daily.  I am having Ms. Havlicek start on escitalopram and busPIRone.  Meds ordered this encounter  Medications  . escitalopram (LEXAPRO) 5 MG tablet    Sig: Take 1 tablet (5 mg total) by mouth daily.    Dispense:  30 tablet    Refill:  1    Order Specific Question:  Supervising Provider    Answer:  Deborra Medina L [[id number]]  . busPIRone (BUSPAR) 7.5 MG tablet    Sig: Take 1 tablet (7.5 mg total) by mouth 2 (two) times daily.    Dispense:  60 tablet    Refill:  0     Order Specific Question:  Supervising Provider    Answer:  Crecencio Mc [[id number]]     Follow-up: Return in about 4 weeks (around 10/09/2015) for Physical exam and medication follow up.

## 2015-09-11 NOTE — Assessment & Plan Note (Signed)
Discussed acute and chronic issues. Reviewed health maintenance measures, PFSHx, and immunizations. Obtain records from previous facility.   

## 2015-10-12 ENCOUNTER — Telehealth: Payer: Self-pay

## 2015-10-12 NOTE — Telephone Encounter (Signed)
Letter sent to patient Sept 30 requesting pt make an appointment has been returned, addressee unknown, no forwarding address.  Pt will be removed from Dr. Merdis Delay patient panel.

## 2015-10-13 ENCOUNTER — Encounter: Payer: BLUE CROSS/BLUE SHIELD | Admitting: Nurse Practitioner

## 2015-10-13 DIAGNOSIS — Z0289 Encounter for other administrative examinations: Secondary | ICD-10-CM

## 2015-10-23 ENCOUNTER — Encounter: Payer: Self-pay | Admitting: Nurse Practitioner

## 2015-10-23 ENCOUNTER — Ambulatory Visit (INDEPENDENT_AMBULATORY_CARE_PROVIDER_SITE_OTHER): Payer: BLUE CROSS/BLUE SHIELD | Admitting: Nurse Practitioner

## 2015-10-23 VITALS — BP 124/76 | HR 74 | Temp 98.0°F | Wt 190.4 lb

## 2015-10-23 DIAGNOSIS — F411 Generalized anxiety disorder: Secondary | ICD-10-CM

## 2015-10-23 DIAGNOSIS — M25561 Pain in right knee: Secondary | ICD-10-CM

## 2015-10-23 DIAGNOSIS — Z1211 Encounter for screening for malignant neoplasm of colon: Secondary | ICD-10-CM | POA: Insufficient documentation

## 2015-10-23 DIAGNOSIS — Z0000 Encounter for general adult medical examination without abnormal findings: Secondary | ICD-10-CM | POA: Diagnosis not present

## 2015-10-23 DIAGNOSIS — G4700 Insomnia, unspecified: Secondary | ICD-10-CM | POA: Diagnosis not present

## 2015-10-23 MED ORDER — ALPRAZOLAM 0.5 MG PO TABS: 0.5000 mg | ORAL_TABLET | Freq: Every evening | ORAL | Status: DC | PRN

## 2015-10-23 NOTE — Assessment & Plan Note (Signed)
Stable on Buspar and Lexapro. Will follow

## 2015-10-23 NOTE — Assessment & Plan Note (Signed)
Unable to fall asleep. Has a test coming up and would like Xanax 0.5 mg to help intermittently with sleep. Will try to find alternatives see HPI for failed meds.

## 2015-10-23 NOTE — Assessment & Plan Note (Addendum)
Worsening after beach trip, asked her to use ibuprofen and ice as needed.

## 2015-10-23 NOTE — Assessment & Plan Note (Signed)
Discussed acute and chronic issues. Reviewed health maintenance measures, PFSHx, and immunizations.   Mammogram order placed Future labs placed Form filled out for pt and given back.

## 2015-10-23 NOTE — Patient Instructions (Signed)

## 2015-10-23 NOTE — Progress Notes (Signed)
Patient ID: Jodi Hawkins, female    DOB: 04-28-1971  Age: 44 y.o. MRN: [id number]  CC: Annual Exam   HPI Corrina Hawkins presents for Annual Exam and form filled out for having one completed.   1) Health Maintenance-   Diet- Eats at home often   Exercise- walking 3 x a week for 1-2 miles (goal)   Immunizations- Flu- UTD  Mammogram- Need to set up   Pap- due in 2017  Eye Exam- UTD  Dental Exam- UTD  LMP- Post-menopausal   2) Chronic Problems-  Insomnia- Still having trouble sleeping    Has tried-ambien, Melatonin, OTC sleep aids   Anxiety- Buspar helping during the day   Right knee-  Worsening, lots of walking at the beach.   History Issabela has a past medical history of Anxiety and Migraines.   She has no past surgical history on file.   Her family history includes Hypertension in her mother, paternal grandfather, and paternal grandmother.She reports that she quit smoking about 3 years ago. She has never used smokeless tobacco. She reports that she does not drink alcohol or use illicit drugs.  Outpatient Prescriptions Prior to Visit  Medication Sig Dispense Refill  . busPIRone (BUSPAR) 7.5 MG tablet Take 1 tablet (7.5 mg total) by mouth 2 (two) times daily. 60 tablet 0  . escitalopram (LEXAPRO) 5 MG tablet Take 1 tablet (5 mg total) by mouth daily. 30 tablet 1   No facility-administered medications prior to visit.    ROS Review of Systems  Constitutional: Negative for fever, chills, diaphoresis, fatigue and unexpected weight change.  HENT: Negative for tinnitus and trouble swallowing.   Eyes: Negative for visual disturbance.  Respiratory: Negative for cough, chest tightness, shortness of breath and wheezing.   Cardiovascular: Negative for chest pain, palpitations and leg swelling.  Gastrointestinal: Negative for nausea, vomiting, abdominal pain, diarrhea, constipation and blood in stool.  Endocrine: Negative for polydipsia, polyphagia and polyuria.  Genitourinary:  Negative for dysuria, hematuria, vaginal discharge and vaginal pain.  Musculoskeletal: Positive for arthralgias. Negative for myalgias, back pain and gait problem.       Right knee painful  Skin: Negative for color change and rash.  Neurological: Negative for dizziness, weakness, numbness and headaches.  Hematological: Does not bruise/bleed easily.  Psychiatric/Behavioral: Positive for sleep disturbance. Negative for suicidal ideas. The patient is nervous/anxious.     Objective:  BP 124/76 mmHg  Pulse 74  Temp(Src) 98 F (36.7 C) (Oral)  Wt 190 lb 6.4 oz (86.365 kg)  SpO2 98%  Physical Exam  Constitutional: She is oriented to person, place, and time. She appears well-developed and well-nourished. No distress.  HENT:  Head: Normocephalic and atraumatic.  Right Ear: External ear normal.  Left Ear: External ear normal.  Nose: Nose normal.  Mouth/Throat: Oropharynx is clear and moist. No oropharyngeal exudate.  TMs and canals clear bilaterally  Eyes: Conjunctivae and EOM are normal. Pupils are equal, round, and reactive to light. Right eye exhibits no discharge. Left eye exhibits no discharge. No scleral icterus.  Neck: Normal range of motion. Neck supple. No thyromegaly present.  Cardiovascular: Normal rate, regular rhythm, normal heart sounds and intact distal pulses.  Exam reveals no gallop and no friction rub.   No murmur heard. Pulmonary/Chest: Effort normal and breath sounds normal. No respiratory distress. She has no wheezes. She has no rales. She exhibits no tenderness.  Clinical breast exam is without significant findings   Abdominal: Soft. Bowel sounds are normal. She exhibits no  distension and no mass. There is no tenderness. There is no rebound and no guarding.  Genitourinary:  Deferred until next year  Musculoskeletal: Normal range of motion. She exhibits no edema or tenderness.  Lymphadenopathy:    She has no cervical adenopathy.  Neurological: She is alert and  oriented to person, place, and time. She has normal reflexes. No cranial nerve deficit. She exhibits normal muscle tone. Coordination normal.  Skin: Skin is warm and dry. No rash noted. She is not diaphoretic. No erythema. No pallor.  Psychiatric: She has a normal mood and affect. Her behavior is normal. Judgment and thought content normal.   Assessment & Plan:   Aleigh was seen today for annual exam.  Diagnoses and all orders for this visit:  Routine general medical examination at a health care facility -     CBC with Differential/Platelet; Future -     Comprehensive metabolic panel; Future -     Hemoglobin A1c; Future -     Lipid panel; Future -     TSH; Future -     Vit D  25 hydroxy (rtn osteoporosis monitoring); Future -     MM DIGITAL SCREENING BILATERAL; Future  Right knee pain  Insomnia  Generalized anxiety disorder  Other orders -     ALPRAZolam (XANAX) 0.5 MG tablet; Take 1 tablet (0.5 mg total) by mouth at bedtime as needed for anxiety.   I am having Ms. Delahunty start on ALPRAZolam. I am also having her maintain her escitalopram and busPIRone.  Meds ordered this encounter  Medications  . ALPRAZolam (XANAX) 0.5 MG tablet    Sig: Take 1 tablet (0.5 mg total) by mouth at bedtime as needed for anxiety.    Dispense:  30 tablet    Refill:  0    Order Specific Question:  Supervising Provider    Answer:  Crecencio Mc [[id number]]     Follow-up: Return for Fasting labs- Needs Appointment within 1 month.

## 2015-10-30 ENCOUNTER — Telehealth: Payer: Self-pay | Admitting: *Deleted

## 2015-10-30 ENCOUNTER — Other Ambulatory Visit (INDEPENDENT_AMBULATORY_CARE_PROVIDER_SITE_OTHER): Payer: BLUE CROSS/BLUE SHIELD

## 2015-10-30 DIAGNOSIS — Z0000 Encounter for general adult medical examination without abnormal findings: Secondary | ICD-10-CM

## 2015-10-30 LAB — COMPREHENSIVE METABOLIC PANEL
ALT: 14 U/L (ref 0–35)
AST: 14 U/L (ref 0–37)
Albumin: 4 g/dL (ref 3.5–5.2)
Alkaline Phosphatase: 81 U/L (ref 39–117)
BILIRUBIN TOTAL: 0.3 mg/dL (ref 0.2–1.2)
BUN: 12 mg/dL (ref 6–23)
CHLORIDE: 106 meq/L (ref 96–112)
CO2: 28 meq/L (ref 19–32)
Calcium: 9.5 mg/dL (ref 8.4–10.5)
Creatinine, Ser: 0.78 mg/dL (ref 0.40–1.20)
GFR: 84.32 mL/min (ref >60.00)
GLUCOSE: 92 mg/dL (ref 70–99)
Potassium: 4.9 mEq/L (ref 3.5–5.1)
Sodium: 143 mEq/L (ref 135–145)
Total Protein: 6.4 g/dL (ref 6.0–8.3)

## 2015-10-30 LAB — LIPID PANEL
CHOL/HDL RATIO: 6
Cholesterol: 210 mg/dL — ABNORMAL HIGH (ref 0–200)
HDL: 36 mg/dL — AB (ref >39.00)
LDL CALC: 146 mg/dL — AB (ref 0–99)
NonHDL: 174.11
TRIGLYCERIDES: 142 mg/dL (ref 0.0–149.0)
VLDL: 28.4 mg/dL (ref 0.0–40.0)

## 2015-10-30 LAB — CBC WITH DIFFERENTIAL/PLATELET
BASOS PCT: 0.6 % (ref 0.0–3.0)
Basophils Absolute: 0 10*3/uL (ref 0.0–0.1)
EOS ABS: 0 10*3/uL (ref 0.0–0.7)
EOS PCT: 0.3 % (ref 0.0–5.0)
HEMATOCRIT: 40.8 % (ref 36.0–46.0)
HEMOGLOBIN: 13.6 g/dL (ref 12.0–15.0)
LYMPHS PCT: 37.9 % (ref 12.0–46.0)
Lymphs Abs: 2.5 10*3/uL (ref 0.7–4.0)
MCHC: 33.3 g/dL (ref 30.0–36.0)
MCV: 88.6 fl (ref 78.0–100.0)
Monocytes Absolute: 0.3 10*3/uL (ref 0.1–1.0)
Monocytes Relative: 5.1 % (ref 3.0–12.0)
Neutro Abs: 3.7 10*3/uL (ref 1.4–7.7)
Neutrophils Relative %: 56.1 % (ref 43.0–77.0)
Platelets: 201 10*3/uL (ref 150.0–400.0)
RBC: 4.6 Mil/uL (ref 3.87–5.11)
RDW: 13.7 % (ref 11.5–15.5)
WBC: 6.6 10*3/uL (ref 4.0–10.5)

## 2015-10-30 LAB — VITAMIN D 25 HYDROXY (VIT D DEFICIENCY, FRACTURES): VITD: 18.44 ng/mL — AB (ref 30.00–100.00)

## 2015-10-30 LAB — TSH: TSH: 0.79 u[IU]/mL (ref 0.35–4.50)

## 2015-10-30 LAB — HEMOGLOBIN A1C: Hgb A1c MFr Bld: 5.6 % (ref 4.6–6.5)

## 2015-10-30 NOTE — Telephone Encounter (Signed)
Patient has dropped off a form to be signed, in reference to lab work done for a jo screening. Form is in box.

## 2015-10-30 NOTE — Telephone Encounter (Signed)
See below

## 2015-10-30 NOTE — Telephone Encounter (Signed)
Noted. Thanks.

## 2015-11-05 ENCOUNTER — Other Ambulatory Visit: Payer: Self-pay | Admitting: Nurse Practitioner

## 2015-11-11 ENCOUNTER — Other Ambulatory Visit: Payer: Self-pay | Admitting: Nurse Practitioner

## 2015-11-17 ENCOUNTER — Ambulatory Visit (INDEPENDENT_AMBULATORY_CARE_PROVIDER_SITE_OTHER): Payer: BLUE CROSS/BLUE SHIELD | Admitting: Nurse Practitioner

## 2015-11-17 VITALS — BP 102/64 | HR 93 | Temp 98.1°F | Resp 14 | Ht 63.0 in | Wt 192.6 lb

## 2015-11-17 DIAGNOSIS — H6592 Unspecified nonsuppurative otitis media, left ear: Secondary | ICD-10-CM | POA: Diagnosis not present

## 2015-11-17 DIAGNOSIS — G4700 Insomnia, unspecified: Secondary | ICD-10-CM

## 2015-11-17 DIAGNOSIS — E559 Vitamin D deficiency, unspecified: Secondary | ICD-10-CM

## 2015-11-17 DIAGNOSIS — F411 Generalized anxiety disorder: Secondary | ICD-10-CM | POA: Diagnosis not present

## 2015-11-17 MED ORDER — AMOXICILLIN 500 MG PO CAPS: 500.0000 mg | ORAL_CAPSULE | Freq: Three times a day (TID) | ORAL | Status: DC

## 2015-11-17 MED ORDER — VITAMIN D (ERGOCALCIFEROL) 1.25 MG (50000 UNIT) PO CAPS: 50000.0000 [IU] | ORAL_CAPSULE | ORAL | Status: DC

## 2015-11-17 NOTE — Patient Instructions (Addendum)
Happy Holidays!   Otitis Media With Effusion Otitis media with effusion is the presence of fluid in the middle ear. This is a common problem in children, which often follows ear infections. It may be present for weeks or longer after the infection. Unlike an acute ear infection, otitis media with effusion refers only to fluid behind the ear drum and not infection. Children with repeated ear and sinus infections and allergy problems are the most likely to get otitis media with effusion. CAUSES  The most frequent cause of the fluid buildup is dysfunction of the eustachian tubes. These are the tubes that drain fluid in the ears to the back of the nose (nasopharynx). SYMPTOMS   The main symptom of this condition is hearing loss. As a result, you or your child may:  Listen to the TV at a loud volume.  Not respond to questions.  Ask "what" often when spoken to.  Mistake or confuse one sound or word for another.  There may be a sensation of fullness or pressure but usually not pain. DIAGNOSIS   Your health care provider will diagnose this condition by examining you or your child's ears.  Your health care provider may test the pressure in you or your child's ear with a tympanometer.  A hearing test may be conducted if the problem persists. TREATMENT   Treatment depends on the duration and the effects of the effusion.  Antibiotics, decongestants, nose drops, and cortisone-type drugs (tablets or nasal spray) may not be helpful.  Children with persistent ear effusions may have delayed language or behavioral problems. Children at risk for developmental delays in hearing, learning, and speech may require referral to a specialist earlier than children not at risk.  You or your child's health care provider may suggest a referral to an ear, nose, and throat surgeon for treatment. The following may help restore normal hearing:  Drainage of fluid.  Placement of ear tubes (tympanostomy  tubes).  Removal of adenoids (adenoidectomy). HOME CARE INSTRUCTIONS   Avoid secondhand smoke.  Infants who are breastfed are less likely to have this condition.  Avoid feeding infants while they are lying flat.  Avoid known environmental allergens.  Avoid people who are sick. SEEK MEDICAL CARE IF:   Hearing is not better in 3 months.  Hearing is worse.  Ear pain.  Drainage from the ear.  Dizziness. MAKE SURE YOU:   Understand these instructions.  Will watch your condition.  Will get help right away if you are not doing well or get worse.   This information is not intended to replace advice given to you by your health care provider. Make sure you discuss any questions you have with your health care provider.   Document Released: 01/12/2005 Document Revised: 12/26/2014 Document Reviewed: 07/02/2013 Elsevier Interactive Patient Education [employer].

## 2015-11-17 NOTE — Progress Notes (Signed)
Patient ID: Jodi Hawkins, female    DOB: 31-Jul-1971  Age: 44 y.o. MRN: [id number]  CC: Follow-up   HPI Jodi Hawkins presents for follow up of anxiety, vitamin D deficiency, and CC of left ear pain.    1) Improving, but not fully better. Trouble staying asleep Getting about 5 hours of sleep, which is somewhat improved.  Anxiety is still the same. Pt reports not ready to up Lexapro  2) Vitamin D- unable to obtain from pharmacy. Would like me to re-send into pharmacy.   3) Left ear bothering her for a week or so. Denies drainage or other URI symptoms. Denies treatment to date or sick contacts.   History Jodi Hawkins has a past medical history of Anxiety and Migraines.   She has no past surgical history on file.   Her family history includes Hypertension in her mother, paternal grandfather, and paternal grandmother.She reports that she quit smoking about 3 years ago. She has never used smokeless tobacco. She reports that she does not drink alcohol or use illicit drugs.  Outpatient Prescriptions Prior to Visit  Medication Sig Dispense Refill  . ALPRAZolam (XANAX) 0.5 MG tablet Take 1 tablet (0.5 mg total) by mouth at bedtime as needed for anxiety. 30 tablet 0  . busPIRone (BUSPAR) 7.5 MG tablet TAKE 1 TABLET (7.5 MG TOTAL) BY MOUTH 2 (TWO) TIMES DAILY. 60 tablet 0  . escitalopram (LEXAPRO) 5 MG tablet TAKE 1 TABLET (5 MG TOTAL) BY MOUTH DAILY. 30 tablet 1   No facility-administered medications prior to visit.    ROS Review of Systems  Constitutional: Negative for fever, chills, diaphoresis and fatigue.  HENT: Positive for ear pain. Negative for congestion, ear discharge, facial swelling, hearing loss, postnasal drip and rhinorrhea.   Eyes: Negative for visual disturbance.  Respiratory: Negative for chest tightness, shortness of breath and wheezing.   Cardiovascular: Negative for chest pain, palpitations and leg swelling.  Gastrointestinal: Negative for nausea, vomiting and diarrhea.   Skin: Negative for rash.  Neurological: Negative for dizziness, weakness, numbness and headaches.  Psychiatric/Behavioral: Positive for sleep disturbance. Negative for suicidal ideas. The patient is nervous/anxious.     Objective:  BP 102/64 mmHg  Pulse 93  Temp(Src) 98.1 F (36.7 C)  Resp 14  Ht 5\' 3"  (1.6 m)  Wt 192 lb 9.6 oz (87.363 kg)  BMI 34.13 kg/m2  SpO2 95%  Physical Exam  Constitutional: She is oriented to person, place, and time. She appears well-developed and well-nourished. No distress.  HENT:  Head: Normocephalic and atraumatic.  Right Ear: External ear normal.  Left Ear: External ear normal.  Mouth/Throat: No oropharyngeal exudate.  Left TM is injected and bulging, landmarks obscured. Right TM landmarks visible and not injected  Cardiovascular: Normal rate, regular rhythm and normal heart sounds.  Exam reveals no gallop and no friction rub.   No murmur heard. Pulmonary/Chest: Effort normal and breath sounds normal. No respiratory distress. She has no wheezes. She has no rales. She exhibits no tenderness.  Neurological: She is alert and oriented to person, place, and time. No cranial nerve deficit. She exhibits normal muscle tone. Coordination normal.  Skin: Skin is warm and dry. No rash noted. She is not diaphoretic.  Psychiatric: She has a normal mood and affect. Her behavior is normal. Judgment and thought content normal.   Assessment & Plan:   Jodi Hawkins was seen today for follow-up.  Diagnoses and all orders for this visit:  Generalized anxiety disorder  Insomnia  OME (otitis media with  effusion), left  Other orders -     Vitamin D, Ergocalciferol, (DRISDOL) 50000 UNITS CAPS capsule; Take 1 capsule (50,000 Units total) by mouth every 7 (seven) days. -     amoxicillin (AMOXIL) 500 MG capsule; Take 1 capsule (500 mg total) by mouth 3 (three) times daily.  I am having Jodi Hawkins start on Vitamin D (Ergocalciferol) and amoxicillin. I am also having her  maintain her ALPRAZolam, busPIRone, and escitalopram.  Meds ordered this encounter  Medications  . Vitamin D, Ergocalciferol, (DRISDOL) 50000 UNITS CAPS capsule    Sig: Take 1 capsule (50,000 Units total) by mouth every 7 (seven) days.    Dispense:  12 capsule    Refill:  0    Order Specific Question:  Supervising Provider    Answer:  Deborra Medina L [[id number]]  . amoxicillin (AMOXIL) 500 MG capsule    Sig: Take 1 capsule (500 mg total) by mouth 3 (three) times daily.    Dispense:  15 capsule    Refill:  0    Order Specific Question:  Supervising Provider    Answer:  Crecencio Mc [[id number]]     Follow-up: Return if symptoms worsen or fail to improve.

## 2015-11-17 NOTE — Progress Notes (Signed)
Pre visit review using our clinic review tool, if applicable. No additional management support is needed unless otherwise documented below in the visit note. 

## 2015-11-22 ENCOUNTER — Encounter: Payer: Self-pay | Admitting: Nurse Practitioner

## 2015-11-22 DIAGNOSIS — H6590 Unspecified nonsuppurative otitis media, unspecified ear: Secondary | ICD-10-CM | POA: Insufficient documentation

## 2015-11-22 DIAGNOSIS — E559 Vitamin D deficiency, unspecified: Secondary | ICD-10-CM | POA: Insufficient documentation

## 2015-11-22 NOTE — Assessment & Plan Note (Signed)
No change to medications at request of pt. Denies need for refills today.Wants to deal with some of this on her own.

## 2015-11-22 NOTE — Assessment & Plan Note (Signed)
Resending in Vitamin D 50,000 units to be taken every 7 days for 12 weeks. FU in 12 weeks.

## 2015-11-22 NOTE — Assessment & Plan Note (Signed)
Xanax is helpful prn. Will continue this and buspar at this time. Will follow

## 2015-11-22 NOTE — Assessment & Plan Note (Signed)
Left OME new onset. Will send in Amoxicillin 500 mg 3 x daily for 5 days. FU prn worsening/failure to improve.

## 2015-11-25 ENCOUNTER — Other Ambulatory Visit: Payer: Self-pay | Admitting: Nurse Practitioner

## 2015-11-25 NOTE — Telephone Encounter (Signed)
Patient was seen on 11/17/15 and las refill was on 10/23/15. Had no refills.

## 2015-12-18 ENCOUNTER — Ambulatory Visit: Payer: BLUE CROSS/BLUE SHIELD

## 2016-01-10 ENCOUNTER — Other Ambulatory Visit: Payer: Self-pay | Admitting: Nurse Practitioner

## 2016-02-24 ENCOUNTER — Other Ambulatory Visit: Payer: Self-pay | Admitting: Nurse Practitioner

## 2016-02-25 NOTE — Telephone Encounter (Signed)
Please advise refill.  Thanks 

## 2016-02-29 NOTE — Telephone Encounter (Signed)
Rx faxed to pharmacy as instructed. 

## 2016-03-13 ENCOUNTER — Other Ambulatory Visit: Payer: Self-pay | Admitting: Nurse Practitioner

## 2016-05-09 ENCOUNTER — Other Ambulatory Visit: Payer: Self-pay

## 2016-05-09 MED ORDER — ESCITALOPRAM OXALATE 5 MG PO TABS: ORAL_TABLET | ORAL | Status: DC

## 2016-05-30 ENCOUNTER — Other Ambulatory Visit: Payer: Self-pay | Admitting: Nurse Practitioner

## 2016-05-30 DIAGNOSIS — Z760 Encounter for issue of repeat prescription: Secondary | ICD-10-CM

## 2016-08-28 ENCOUNTER — Other Ambulatory Visit: Payer: Self-pay | Admitting: Family Medicine

## 2016-08-28 DIAGNOSIS — Z760 Encounter for issue of repeat prescription: Secondary | ICD-10-CM

## 2016-08-29 NOTE — Telephone Encounter (Signed)
faxed

## 2016-08-29 NOTE — Telephone Encounter (Signed)
Refilled 05/30/16 with 2 refills. No future appointment with new PCP. Please advise?

## 2016-10-03 ENCOUNTER — Other Ambulatory Visit: Payer: Self-pay | Admitting: Family Medicine

## 2016-10-03 DIAGNOSIS — Z760 Encounter for issue of repeat prescription: Secondary | ICD-10-CM

## 2016-10-03 NOTE — Telephone Encounter (Signed)
faxed

## 2016-10-03 NOTE — Telephone Encounter (Signed)
Refilled 08/2016. Pt last seen by Morey Hummingbird on 11/17/15. No future appt made. Please advise?

## 2016-10-28 ENCOUNTER — Other Ambulatory Visit: Payer: Self-pay | Admitting: Family Medicine

## 2016-10-28 DIAGNOSIS — Z760 Encounter for issue of repeat prescription: Secondary | ICD-10-CM

## 2016-10-28 NOTE — Telephone Encounter (Signed)
Refilled 10/03/16. Pt last seen by Morey Hummingbird on 11/17/15. Please advise?

## 2016-10-28 NOTE — Telephone Encounter (Signed)
faxed

## 2016-11-01 ENCOUNTER — Telehealth: Payer: Self-pay | Admitting: Family

## 2016-11-01 ENCOUNTER — Encounter: Payer: Self-pay | Admitting: Family

## 2016-11-01 ENCOUNTER — Ambulatory Visit (INDEPENDENT_AMBULATORY_CARE_PROVIDER_SITE_OTHER): Payer: PRIVATE HEALTH INSURANCE | Admitting: Family

## 2016-11-01 VITALS — BP 118/78 | HR 99 | Temp 97.9°F | Ht 63.0 in | Wt 195.6 lb

## 2016-11-01 DIAGNOSIS — H6982 Other specified disorders of Eustachian tube, left ear: Secondary | ICD-10-CM | POA: Diagnosis not present

## 2016-11-01 DIAGNOSIS — F411 Generalized anxiety disorder: Secondary | ICD-10-CM

## 2016-11-01 DIAGNOSIS — Z23 Encounter for immunization: Secondary | ICD-10-CM

## 2016-11-01 DIAGNOSIS — H6992 Unspecified Eustachian tube disorder, left ear: Secondary | ICD-10-CM | POA: Insufficient documentation

## 2016-11-01 MED ORDER — TRAZODONE HCL 50 MG PO TABS: 50.0000 mg | ORAL_TABLET | Freq: Every day | ORAL | 0 refills | Status: DC

## 2016-11-01 MED ORDER — PREDNISONE 10 MG PO TABS: ORAL_TABLET | ORAL | 0 refills | Status: DC

## 2016-11-01 NOTE — Progress Notes (Signed)
Subjective:    Patient ID: Jodi Hawkins, female    DOB: 1971-04-06, 45 y.o.   MRN: [id number]  CC: Jodi Hawkins is a 45 y.o. female who presents today for an acute visit.    HPI: Patient here for acute visit with chief complaint of ear pain bilateral since moved to , 2 years ago. Left ear worse than right.  Takes antihistamine and feel popping. Tried Debrox  no relief. Endorses sinus congestion, and nasal congestion, itchy watery eyes off and on which resolved with antihistamine.   Anxiety- h/o of panic attacks. Stable on Lexapro dose. Originally started when moved to [facility], for depression  however depression has largely resolved. Trouble falling asleep due to anxiety and takes xanax nightly to sleep. Stress from son living away. No thoughts of hurting herself or anyone else.     HISTORY:  Past Medical History:  Diagnosis Date  . Anxiety   . Migraines    History reviewed. No pertinent surgical history. Family History  Problem Relation Age of Onset  . Hypertension Mother   . Hypertension Paternal Grandmother   . Hypertension Paternal Grandfather     Allergies: Patient has no known allergies. Current Outpatient Prescriptions on File Prior to Visit  Medication Sig Dispense Refill  . escitalopram (LEXAPRO) 5 MG tablet TAKE 1 TABLET (5 MG TOTAL) BY MOUTH DAILY. 90 tablet 1   No current facility-administered medications on file prior to visit.     Social History  Substance Use Topics  . Smoking status: Former Smoker    Quit date: 06/18/2012  . Smokeless tobacco: Never Used  . Alcohol use No    Review of Systems  Constitutional: Negative for chills and fever.  HENT: Positive for congestion and ear pain. Negative for ear discharge, sinus pain, sinus pressure and sore throat.   Eyes: Negative for visual disturbance.  Respiratory: Negative for cough, shortness of breath and wheezing.   Cardiovascular: Negative for chest pain and palpitations.  Gastrointestinal: Negative for  nausea and vomiting.  Neurological: Negative for headaches.  Psychiatric/Behavioral: Positive for sleep disturbance. Negative for suicidal ideas. The patient is nervous/anxious.       Objective:    BP 118/78   Pulse 99   Temp 97.9 F (36.6 C) (Oral)   Ht 5\' 3"  (1.6 m)   Wt 195 lb 9.6 oz (88.7 kg)   SpO2 97%   BMI 34.65 kg/m    Physical Exam  Constitutional: She appears well-developed and well-nourished.  HENT:  Head: Normocephalic and atraumatic.  Right Ear: Hearing, tympanic membrane, external ear and ear canal normal. No drainage, swelling or tenderness. No foreign bodies. Tympanic membrane is not erythematous and not bulging. No middle ear effusion. No decreased hearing is noted.  Left Ear: Hearing, tympanic membrane, external ear and ear canal normal. No drainage, swelling or tenderness. No foreign bodies. Tympanic membrane is not erythematous and not bulging.  No middle ear effusion. No decreased hearing is noted.  Nose: Nose normal. No rhinorrhea. Right sinus exhibits no maxillary sinus tenderness and no frontal sinus tenderness. Left sinus exhibits no maxillary sinus tenderness and no frontal sinus tenderness.  Mouth/Throat: Uvula is midline, oropharynx is clear and moist and mucous membranes are normal. No oropharyngeal exudate, posterior oropharyngeal edema, posterior oropharyngeal erythema or tonsillar abscesses.  Eyes: Conjunctivae are normal.  Cardiovascular: Normal rate, regular rhythm, normal heart sounds and normal pulses.   Pulmonary/Chest: Effort normal and breath sounds normal. She has no wheezes. She has no rhonchi. She  has no rales.  Lymphadenopathy:       Head (right side): No submental, no submandibular, no tonsillar, no preauricular, no posterior auricular and no occipital adenopathy present.       Head (left side): No submental, no submandibular, no tonsillar, no preauricular, no posterior auricular and no occipital adenopathy present.    She has no cervical  adenopathy.  Neurological: She is alert.  Skin: Skin is warm and dry.  Psychiatric: She has a normal mood and affect. Her speech is normal and behavior is normal. Thought content normal.  Vitals reviewed.      Assessment & Plan:   Problem List Items Addressed This Visit      Nervous and Auditory   Acute dysfunction of left eustachian tube - Primary    Acute on chronic. Suspect seasonal allergies since moving with her line is playing a role. Trial of a short prednisone taper. Note: Advised patient to stop taking over-the-counter antihistamines while we are trialing trazodone due to sedating properties and potential side effects.      Relevant Medications   predniSONE (DELTASONE) 10 MG tablet     Other   Generalized anxiety disorder    Continues to have trouble sleeping from anxiety. Discussed risks of BZDs and patient agreed with discontinuing Xanax. Trial trazodone. F/u 6 weeks.       Relevant Medications   traZODone (DESYREL) 50 MG tablet    Other Visit Diagnoses    Encounter for immunization       Relevant Medications   predniSONE (DELTASONE) 10 MG tablet   Other Relevant Orders   Flu Vaccine QUAD 36+ mos IM (Completed)        I have discontinued Ms. Lamke's busPIRone, Vitamin D (Ergocalciferol), amoxicillin, and ALPRAZolam. I am also having her start on predniSONE and traZODone. Additionally, I am having her maintain her escitalopram.   Meds ordered this encounter  Medications  . predniSONE (DELTASONE) 10 MG tablet    Sig: Take 40 mg by mouth on day 1, then taper 10 mg daily until gone    Dispense:  10 tablet    Refill:  0    Order Specific Question:   Supervising Provider    Answer:   Deborra Medina L [[id number]]  . traZODone (DESYREL) 50 MG tablet    Sig: Take 1 tablet (50 mg total) by mouth at bedtime.    Dispense:  90 tablet    Refill:  0    Order Specific Question:   Supervising Provider    Answer:   Crecencio Mc [[id number]]    Return precautions given.    Risks, benefits, and alternatives of the medications and treatment plan prescribed today were discussed, and patient expressed understanding.   Education regarding symptom management and diagnosis given to patient on AVS.  Continue to follow with Mable Paris, FNP for routine health maintenance.   Elton Sin and I agreed with plan.   Mable Paris, FNP

## 2016-11-01 NOTE — Assessment & Plan Note (Signed)
Acute on chronic. Suspect seasonal allergies since moving with her line is playing a role. Trial of a short prednisone taper. Note: Advised patient to stop taking over-the-counter antihistamines while we are trialing trazodone due to sedating properties and potential side effects.

## 2016-11-01 NOTE — Assessment & Plan Note (Addendum)
Continues to have trouble sleeping from anxiety. Discussed risks of BZDs and patient agreed with discontinuing Xanax. Trial trazodone. F/u 6 weeks.

## 2016-11-01 NOTE — Progress Notes (Signed)
Pre visit review using our clinic review tool, if applicable. No additional management support is needed unless otherwise documented below in the visit note. 

## 2016-11-01 NOTE — Patient Instructions (Addendum)
Trial Flonase  Trial of prednisone taper  Trial of trazodone for sleep  Fu 6 weeks and then for physical  If there is no improvement in your symptoms, or if there is any worsening of symptoms, or if you have any additional concerns, please return for re-evaluation; or, if we are closed, consider going to the Emergency Room for evaluation if symptoms urgent.

## 2016-11-03 ENCOUNTER — Telehealth: Payer: Self-pay | Admitting: *Deleted

## 2016-11-03 ENCOUNTER — Other Ambulatory Visit: Payer: Self-pay

## 2016-11-03 DIAGNOSIS — H6982 Other specified disorders of Eustachian tube, left ear: Secondary | ICD-10-CM

## 2016-11-03 MED ORDER — PREDNISONE 10 MG PO TABS: ORAL_TABLET | ORAL | 0 refills | Status: DC

## 2016-11-03 NOTE — Telephone Encounter (Signed)
Medication has been resent

## 2016-11-03 NOTE — Telephone Encounter (Signed)
closed

## 2016-11-03 NOTE — Telephone Encounter (Signed)
Pt stated that [facility] did not receive the Rx for prednisone

## 2016-11-03 NOTE — Telephone Encounter (Signed)
Pharmacy did not receive medication.  Medication has been resent.

## 2016-11-09 ENCOUNTER — Other Ambulatory Visit: Payer: Self-pay | Admitting: Family Medicine

## 2016-11-09 NOTE — Telephone Encounter (Signed)
Refilled 05/09/16. Pt last seen 11/01/16.

## 2016-11-14 ENCOUNTER — Other Ambulatory Visit: Payer: Self-pay | Admitting: Family

## 2017-01-16 ENCOUNTER — Ambulatory Visit (INDEPENDENT_AMBULATORY_CARE_PROVIDER_SITE_OTHER): Payer: 59 | Admitting: Family

## 2017-01-16 ENCOUNTER — Encounter: Payer: Self-pay | Admitting: Family

## 2017-01-16 VITALS — BP 96/66 | HR 85 | Temp 97.9°F | Ht 63.0 in | Wt 191.0 lb

## 2017-01-16 DIAGNOSIS — F411 Generalized anxiety disorder: Secondary | ICD-10-CM | POA: Diagnosis not present

## 2017-01-16 DIAGNOSIS — Z0000 Encounter for general adult medical examination without abnormal findings: Secondary | ICD-10-CM | POA: Diagnosis not present

## 2017-01-16 LAB — CBC WITH DIFFERENTIAL/PLATELET
BASOS PCT: 0.9 % (ref 0.0–3.0)
Basophils Absolute: 0.1 10*3/uL (ref 0.0–0.1)
EOS PCT: 0.3 % (ref 0.0–5.0)
Eosinophils Absolute: 0 10*3/uL (ref 0.0–0.7)
HCT: 42.1 % (ref 36.0–46.0)
Hemoglobin: 14.2 g/dL (ref 12.0–15.0)
LYMPHS ABS: 2.2 10*3/uL (ref 0.7–4.0)
Lymphocytes Relative: 39.1 % (ref 12.0–46.0)
MCHC: 33.7 g/dL (ref 30.0–36.0)
MCV: 89.2 fl (ref 78.0–100.0)
MONOS PCT: 5.9 % (ref 3.0–12.0)
Monocytes Absolute: 0.3 10*3/uL (ref 0.1–1.0)
NEUTROS ABS: 3 10*3/uL (ref 1.4–7.7)
Neutrophils Relative %: 53.8 % (ref 43.0–77.0)
PLATELETS: 204 10*3/uL (ref 150.0–400.0)
RBC: 4.71 Mil/uL (ref 3.87–5.11)
RDW: 13.9 % (ref 11.5–15.5)
WBC: 5.7 10*3/uL (ref 4.0–10.5)

## 2017-01-16 LAB — HEMOGLOBIN A1C: Hgb A1c MFr Bld: 5.7 % (ref 4.6–6.5)

## 2017-01-16 LAB — COMPREHENSIVE METABOLIC PANEL
ALT: 13 U/L (ref 0–35)
AST: 13 U/L (ref 0–37)
Albumin: 4.3 g/dL (ref 3.5–5.2)
Alkaline Phosphatase: 71 U/L (ref 39–117)
BILIRUBIN TOTAL: 0.3 mg/dL (ref 0.2–1.2)
BUN: 11 mg/dL (ref 6–23)
CALCIUM: 10.1 mg/dL (ref 8.4–10.5)
CO2: 29 meq/L (ref 19–32)
Chloride: 104 mEq/L (ref 96–112)
Creatinine, Ser: 0.85 mg/dL (ref 0.40–1.20)
GFR: 75.96 mL/min (ref >60.00)
GLUCOSE: 89 mg/dL (ref 70–99)
POTASSIUM: 4.7 meq/L (ref 3.5–5.1)
Sodium: 141 mEq/L (ref 135–145)
Total Protein: 7 g/dL (ref 6.0–8.3)

## 2017-01-16 LAB — LDL CHOLESTEROL, DIRECT: LDL DIRECT: 167 mg/dL

## 2017-01-16 LAB — B12 AND FOLATE PANEL
Folate: 10.5 ng/mL (ref >5.9)
Vitamin B-12: 249 pg/mL (ref 211–911)

## 2017-01-16 LAB — TSH: TSH: 0.74 u[IU]/mL (ref 0.35–4.50)

## 2017-01-16 LAB — LIPID PANEL
CHOL/HDL RATIO: 6
CHOLESTEROL: 236 mg/dL — AB (ref 0–200)
HDL: 38.2 mg/dL — AB (ref >39.00)
NONHDL: 197.86
TRIGLYCERIDES: 204 mg/dL — AB (ref 0.0–149.0)
VLDL: 40.8 mg/dL — ABNORMAL HIGH (ref 0.0–40.0)

## 2017-01-16 LAB — VITAMIN D 25 HYDROXY (VIT D DEFICIENCY, FRACTURES): VITD: 16.13 ng/mL — ABNORMAL LOW (ref 30.00–100.00)

## 2017-01-16 MED ORDER — TRAZODONE HCL 100 MG PO TABS: 100.0000 mg | ORAL_TABLET | Freq: Every day | ORAL | 1 refills | Status: DC

## 2017-01-16 NOTE — Progress Notes (Signed)
Subjective:    Patient ID: Elaya Hosteen, female    DOB: Jan 03, 1971, 46 y.o.   MRN: [id number]  CC: Laquanda Foti is a 46 y.o. female who presents today for physical exam.    HPI: Feeling well.   Doing well on trazodone and Lexapro. Wonders if she increased trazodone if she would be able to sleep even better.    Colorectal Cancer Screening: No early family history to her knowledge.  Breast Cancer Screening: Mammogram due Cervical Cancer Screening:  Due; will come back.  Bone Health screening/DEXA for 65+: No increased fracture risk. Defer screening at this time. Lung Cancer Screening: Doesn't have 30 year pack year history and age > 34 years. Quit at 52 yrs.  Immunizations       Tetanus - UTD   Labs: Screening labs today. Exercise: Gets regular exercise.  Alcohol use: None Smoking/tobacco use: Former smoker.  Regular dental exams: UTD. Wears seat belt: Yes. Skin _ no new skin lesions.  HISTORY:  Past Medical History:  Diagnosis Date  . Anxiety   . Migraines     History reviewed. No pertinent surgical history. Family History  Problem Relation Age of Onset  . Hypertension Mother   . Hypertension Paternal Grandmother   . Hypertension Paternal Grandfather       ALLERGIES: Patient has no known allergies.  Current Outpatient Prescriptions on File Prior to Visit  Medication Sig Dispense Refill  . escitalopram (LEXAPRO) 5 MG tablet TAKE 1 TABLET (5 MG TOTAL) BY MOUTH DAILY. 90 tablet 1  . predniSONE (DELTASONE) 10 MG tablet Take 40 mg by mouth on day 1, then taper 10 mg daily until gone 10 tablet 0   No current facility-administered medications on file prior to visit.     Social History  Substance Use Topics  . Smoking status: Former Smoker    Quit date: 06/18/2012  . Smokeless tobacco: Never Used  . Alcohol use No    Review of Systems  Constitutional: Negative for chills, fever and unexpected weight change.  HENT: Negative for congestion.   Respiratory:  Negative for cough.   Cardiovascular: Negative for chest pain, palpitations and leg swelling.  Gastrointestinal: Negative for nausea and vomiting.  Musculoskeletal: Negative for arthralgias and myalgias.  Skin: Negative for rash.  Neurological: Negative for headaches.  Hematological: Negative for adenopathy.  Psychiatric/Behavioral: Positive for sleep disturbance. Negative for confusion. The patient is nervous/anxious.       Objective:    BP 96/66   Pulse 85   Temp 97.9 F (36.6 C) (Oral)   Ht 5\' 3"  (1.6 m)   Wt 191 lb (86.6 kg)   SpO2 95%   BMI 33.83 kg/m   BP Readings from Last 3 Encounters:  01/16/17 96/66  11/01/16 118/78  11/17/15 102/64   Wt Readings from Last 3 Encounters:  01/16/17 191 lb (86.6 kg)  11/01/16 195 lb 9.6 oz (88.7 kg)  11/17/15 192 lb 9.6 oz (87.4 kg)    Physical Exam  Constitutional: She appears well-developed and well-nourished.  Eyes: Conjunctivae are normal.  Neck: No thyroid mass and no thyromegaly present.  Cardiovascular: Normal rate, regular rhythm, normal heart sounds and normal pulses.   Pulmonary/Chest: Effort normal and breath sounds normal. She has no wheezes. She has no rhonchi. She has no rales. Right breast exhibits no inverted nipple, no mass, no nipple discharge, no skin change and no tenderness. Left breast exhibits no inverted nipple, no mass, no nipple discharge, no skin change and no  tenderness. Breasts are symmetrical.  CBE performed. Dense, ropey breasts bilaterally  Lymphadenopathy:       Head (right side): No submental, no submandibular, no tonsillar, no preauricular, no posterior auricular and no occipital adenopathy present.       Head (left side): No submental, no submandibular, no tonsillar, no preauricular, no posterior auricular and no occipital adenopathy present.    She has no cervical adenopathy.       Right cervical: No superficial cervical, no deep cervical and no posterior cervical adenopathy present.      Left  cervical: No superficial cervical, no deep cervical and no posterior cervical adenopathy present.    She has no axillary adenopathy.  Neurological: She is alert.  Skin: Skin is warm and dry.  Psychiatric: She has a normal mood and affect. Her speech is normal and behavior is normal. Thought content normal.  Vitals reviewed.      Assessment & Plan:   Problem List Items Addressed This Visit      Other   Generalized anxiety disorder    Anxiety and depression well controlled. Concern for serotonin syndrome with high  doses of tramadol and SSRI;  Patient on low dose lexapro. Wil trial 100mg  trazodone. Education provided for signs and symptoms of serotonin syndrome. Will monitor.       Relevant Medications   traZODone (DESYREL) 100 MG tablet   Routine general medical examination at a health care facility - Primary    No early family history colon cancer. Mammogram due in as ordered. Patient understands to schedule. Encouraged her to schedule 3-D  due to size, the density of her breasts. She is also due for Pap smear however patient was in a rush and states she will come back for Pap smear later date. No indications for DEXA scan at this time. She does not meet lung cancer screening criteria. Labs today. Encouraged continued exercise.       Relevant Orders   CBC with Differential/Platelet   Comprehensive metabolic panel   Hemoglobin A1c   Lipid panel   TSH   VITAMIN D 25 Hydroxy (Vit-D Deficiency, Fractures)   MM DIGITAL SCREENING BILATERAL   B12 and Folate Panel       I have changed Ms. Hirota's traZODone. I am also having her maintain her predniSONE and escitalopram.   Meds ordered this encounter  Medications  . traZODone (DESYREL) 100 MG tablet    Sig: Take 1 tablet (100 mg total) by mouth at bedtime.    Dispense:  90 tablet    Refill:  1    Order Specific Question:   Supervising Provider    Answer:   Crecencio Mc [[id number]]    Return precautions given.   Risks,  benefits, and alternatives of the medications and treatment plan prescribed today were discussed, and patient expressed understanding.   Education regarding symptom management and diagnosis given to patient on AVS.   Continue to follow with Mable Paris, FNP for routine health maintenance.   Elton Sin and I agreed with plan.   Mable Paris, FNP

## 2017-01-16 NOTE — Progress Notes (Signed)
Pre visit review using our clinic review tool, if applicable. No additional management support is needed unless otherwise documented below in the visit note. 

## 2017-01-16 NOTE — Assessment & Plan Note (Signed)
No early family history colon cancer. Mammogram due in as ordered. Patient understands to schedule. Encouraged her to schedule 3-D  due to size, the density of her breasts. She is also due for Pap smear however patient was in a rush and states she will come back for Pap smear later date. No indications for DEXA scan at this time. She does not meet lung cancer screening criteria. Labs today. Encouraged continued exercise.

## 2017-01-16 NOTE — Assessment & Plan Note (Addendum)
Anxiety and depression well controlled. Concern for serotonin syndrome with high  doses of tramadol and SSRI;  Patient on low dose lexapro. Wil trial 100mg  trazodone. Education provided for signs and symptoms of serotonin syndrome. Will monitor.

## 2017-01-16 NOTE — Patient Instructions (Addendum)
Labs     We placed a referral. Mammogram this year. I asked that you call one the below locations and schedule this when it is convenient for you.   If you have dense breasts, you may ask for 3D mammogram over the traditional 2D mammogram as new evidence suggest 3D is superior. Please note that NOT all insurance companies cover 3D and you may have to pay a higher copay. You may call your insurance company to further clarify your benefits.   Options for [place]  [place], [place]  * [facility] [place], [place] * Note if you ask for 3D mammogram at this location, you must request [place], [place] location*  Health Maintenance, Female Introduction Adopting a healthy lifestyle and getting preventive care can go a long way to promote health and wellness. Talk with your health care provider about what schedule of regular examinations is right for you. This is a good chance for you to check in with your provider about disease prevention and staying healthy. In between checkups, there are plenty of things you can do on your own. Experts have done a lot of research about which lifestyle changes and preventive measures are most likely to keep you healthy. Ask your health care provider for more information. Weight and diet Eat a healthy diet  Be sure to include plenty of vegetables, fruits, low-fat dairy products, and lean protein.  Do not eat a lot of foods high in solid fats, added sugars, or salt.  Get regular exercise. This is one of the most important things you can do for your health.  Most adults should exercise for at least 150 minutes each week. The exercise should increase your heart rate and make you sweat (moderate-intensity exercise).  Most adults should also do strengthening exercises at least twice a week. This is in addition  to the moderate-intensity exercise. Maintain a healthy weight  Body mass index (BMI) is a measurement that can be used to identify possible weight problems. It estimates body fat based on height and weight. Your health care provider can help determine your BMI and help you achieve or maintain a healthy weight.  For females 25 years of age and older:  A BMI below 18.5 is considered underweight.  A BMI of 18.5 to 24.9 is normal.  A BMI of 25 to 29.9 is considered overweight.  A BMI of 30 and above is considered obese. Watch levels of cholesterol and blood lipids  You should start having your blood tested for lipids and cholesterol at 48 years of age, then have this test every 5 years.  You may need to have your cholesterol levels checked more often if:  Your lipid or cholesterol levels are high.  You are older than 48 years of age.  You are at high risk for heart disease. Cancer screening Lung Cancer  Lung cancer screening is recommended for adults 66-47 years old who are at high risk for lung cancer because of a history of smoking.  A yearly low-dose CT scan of the lungs is recommended for people who:  Currently smoke.  Have quit within the past 15 years.  Have at least a 30-pack-year history of smoking. A pack year is smoking an average of one pack of cigarettes a day for 1 year.  Yearly screening should continue until it has been  15 years since you quit.  Yearly screening should stop if you develop a health problem that would prevent you from having lung cancer treatment. Breast Cancer  Practice breast self-awareness. This means understanding how your breasts normally appear and feel.  It also means doing regular breast self-exams. Let your health care provider know about any changes, no matter how small.  If you are in your 20s or 30s, you should have a clinical breast exam (CBE) by a health care provider every 1-3 years as part of a regular health exam.  If you are  71 or older, have a CBE every year. Also consider having a breast X-ray (mammogram) every year.  If you have a family history of breast cancer, talk to your health care provider about genetic screening.  If you are at high risk for breast cancer, talk to your health care provider about having an MRI and a mammogram every year.  Breast cancer gene (BRCA) assessment is recommended for women who have family members with BRCA-related cancers. BRCA-related cancers include:  Breast.  Ovarian.  Tubal.  Peritoneal cancers.  Results of the assessment will determine the need for genetic counseling and BRCA1 and BRCA2 testing. Cervical Cancer  Your health care provider may recommend that you be screened regularly for cancer of the pelvic organs (ovaries, uterus, and vagina). This screening involves a pelvic examination, including checking for microscopic changes to the surface of your cervix (Pap test). You may be encouraged to have this screening done every 3 years, beginning at age 56.  For women ages 43-65, health care providers may recommend pelvic exams and Pap testing every 3 years, or they may recommend the Pap and pelvic exam, combined with testing for human papilloma virus (HPV), every 5 years. Some types of HPV increase your risk of cervical cancer. Testing for HPV may also be done on women of any age with unclear Pap test results.  Other health care providers may not recommend any screening for nonpregnant women who are considered low risk for pelvic cancer and who do not have symptoms. Ask your health care provider if a screening pelvic exam is right for you.  If you have had past treatment for cervical cancer or a condition that could lead to cancer, you need Pap tests and screening for cancer for at least 20 years after your treatment. If Pap tests have been discontinued, your risk factors (such as having a new sexual partner) need to be reassessed to determine if screening should resume.  Some women have medical problems that increase the chance of getting cervical cancer. In these cases, your health care provider may recommend more frequent screening and Pap tests. Colorectal Cancer  This type of cancer can be detected and often prevented.  Routine colorectal cancer screening usually begins at 48 years of age and continues through 48 years of age.  Your health care provider may recommend screening at an earlier age if you have risk factors for colon cancer.  Your health care provider may also recommend using home test kits to check for hidden blood in the stool.  A small camera at the end of a tube can be used to examine your colon directly (sigmoidoscopy or colonoscopy). This is done to check for the earliest forms of colorectal cancer.  Routine screening usually begins at age 65.  Direct examination of the colon should be repeated every 5-10 years through 48 years of age. However, you may need to be screened more often if  early forms of precancerous polyps or small growths are found. Skin Cancer  Check your skin from head to toe regularly.  Tell your health care provider about any new moles or changes in moles, especially if there is a change in a mole's shape or color.  Also tell your health care provider if you have a mole that is larger than the size of a pencil eraser.  Always use sunscreen. Apply sunscreen liberally and repeatedly throughout the day.  Protect yourself by wearing long sleeves, pants, a wide-brimmed hat, and sunglasses whenever you are outside. Heart disease, diabetes, and high blood pressure  High blood pressure causes heart disease and increases the risk of stroke. High blood pressure is more likely to develop in:  People who have blood pressure in the high end of the normal range (130-139/85-89 mm Hg).  People who are overweight or obese.  People who are African American.  If you are 18-39 years of age, have your blood pressure checked  every 3-5 years. If you are 40 years of age or older, have your blood pressure checked every year. You should have your blood pressure measured twice-once when you are at a hospital or clinic, and once when you are not at a hospital or clinic. Record the average of the two measurements. To check your blood pressure when you are not at a hospital or clinic, you can use:  An automated blood pressure machine at a pharmacy.  A home blood pressure monitor.  If you are between 55 years and 79 years old, ask your health care provider if you should take aspirin to prevent strokes.  Have regular diabetes screenings. This involves taking a blood sample to check your fasting blood sugar level.  If you are at a normal weight and have a low risk for diabetes, have this test once every three years after 48 years of age.  If you are overweight and have a high risk for diabetes, consider being tested at a younger age or more often. Preventing infection Hepatitis B  If you have a higher risk for hepatitis B, you should be screened for this virus. You are considered at high risk for hepatitis B if:  You were born in a country where hepatitis B is common. Ask your health care provider which countries are considered high risk.  Your parents were born in a high-risk country, and you have not been immunized against hepatitis B (hepatitis B vaccine).  You have HIV or AIDS.  You use needles to inject street drugs.  You live with someone who has hepatitis B.  You have had sex with someone who has hepatitis B.  You get hemodialysis treatment.  You take certain medicines for conditions, including cancer, organ transplantation, and autoimmune conditions. Hepatitis C  Blood testing is recommended for:  Everyone born from 1945 through 1965.  Anyone with known risk factors for hepatitis C. Sexually transmitted infections (STIs)  You should be screened for sexually transmitted infections (STIs) including  gonorrhea and chlamydia if:  You are sexually active and are younger than 48 years of age.  You are older than 48 years of age and your health care provider tells you that you are at risk for this type of infection.  Your sexual activity has changed since you were last screened and you are at an increased risk for chlamydia or gonorrhea. Ask your health care provider if you are at risk.  If you do not have HIV, but are at   risk, it may be recommended that you take a prescription medicine daily to prevent HIV infection. This is called pre-exposure prophylaxis (PrEP). You are considered at risk if:  You are sexually active and do not regularly use condoms or know the HIV status of your partner(s).  You take drugs by injection.  You are sexually active with a partner who has HIV. Talk with your health care provider about whether you are at high risk of being infected with HIV. If you choose to begin PrEP, you should first be tested for HIV. You should then be tested every 3 months for as long as you are taking PrEP. Pregnancy  If you are premenopausal and you may become pregnant, ask your health care provider about preconception counseling.  If you may become pregnant, take 400 to 800 micrograms (mcg) of folic acid every day.  If you want to prevent pregnancy, talk to your health care provider about birth control (contraception). Osteoporosis and menopause  Osteoporosis is a disease in which the bones lose minerals and strength with aging. This can result in serious bone fractures. Your risk for osteoporosis can be identified using a bone density scan.  If you are 73 years of age or older, or if you are at risk for osteoporosis and fractures, ask your health care provider if you should be screened.  Ask your health care provider whether you should take a calcium or vitamin D supplement to lower your risk for osteoporosis.  Menopause may have certain physical symptoms and risks.  Hormone  replacement therapy may reduce some of these symptoms and risks. Talk to your health care provider about whether hormone replacement therapy is right for you. Follow these instructions at home:  Schedule regular health, dental, and eye exams.  Stay current with your immunizations.  Do not use any tobacco products including cigarettes, chewing tobacco, or electronic cigarettes.  If you are pregnant, do not drink alcohol.  If you are breastfeeding, limit how much and how often you drink alcohol.  Limit alcohol intake to no more than 1 drink per day for nonpregnant women. One drink equals 12 ounces of beer, 5 ounces of wine, or 1 ounces of hard liquor.  Do not use street drugs.  Do not share needles.  Ask your health care provider for help if you need support or information about quitting drugs.  Tell your health care provider if you often feel depressed.  Tell your health care provider if you have ever been abused or do not feel safe at home. This information is not intended to replace advice given to you by your health care provider. Make sure you discuss any questions you have with your health care provider. Document Released: 06/20/2011 Document Revised: 05/12/2016 Document Reviewed: 09/08/2015  2017 [employer]vier

## 2017-01-19 NOTE — Progress Notes (Signed)
Patient has been notified

## 2017-02-03 ENCOUNTER — Encounter: Payer: Self-pay | Admitting: Family

## 2017-02-20 ENCOUNTER — Ambulatory Visit (INDEPENDENT_AMBULATORY_CARE_PROVIDER_SITE_OTHER): Payer: 59 | Admitting: Family

## 2017-02-20 ENCOUNTER — Other Ambulatory Visit (HOSPITAL_COMMUNITY)
Admission: RE | Admit: 2017-02-20 | Discharge: 2017-02-20 | Disposition: A | Discharge status: Home / Self Care | Payer: 59 | Source: Ambulatory Visit | Attending: Family | Admitting: Family

## 2017-02-20 ENCOUNTER — Encounter: Payer: Self-pay | Admitting: Family

## 2017-02-20 VITALS — BP 98/72 | HR 97 | Temp 98.5°F | Ht 62.75 in | Wt 184.8 lb

## 2017-02-20 DIAGNOSIS — Z124 Encounter for screening for malignant neoplasm of cervix: Secondary | ICD-10-CM | POA: Diagnosis not present

## 2017-02-20 DIAGNOSIS — R0981 Nasal congestion: Secondary | ICD-10-CM

## 2017-02-20 DIAGNOSIS — Z1151 Encounter for screening for human papillomavirus (HPV): Secondary | ICD-10-CM | POA: Diagnosis present

## 2017-02-20 DIAGNOSIS — Z01419 Encounter for gynecological examination (general) (routine) without abnormal findings: Secondary | ICD-10-CM | POA: Diagnosis present

## 2017-02-20 LAB — HM PAP SMEAR: HM PAP: NEGATIVE

## 2017-02-20 NOTE — Progress Notes (Signed)
Pre visit review using our clinic review tool, if applicable. No additional management support is needed unless otherwise documented below in the visit note. 

## 2017-02-20 NOTE — Progress Notes (Signed)
Subjective:    Patient ID: Jodi Hawkins, female    DOB: 13-Oct-1971, 46 y.o.   MRN: [id number]  CC: Jodi Hawkins is a 46 y.o. female who presents today for follow up.   HPI: Complains of ear pressure, 2 days, unchanged. Ear pressure right. Nasal congestion, clear runny. No fever, cough. Seasonal allergies. Hasn't tried any medication.  No vaginal complaints.no dyspareunia, vaginal bleeding. No longer has menstrual periods.         HISTORY:  Past Medical History:  Diagnosis Date  . Anxiety   . Migraines    No past surgical history on file. Family History  Problem Relation Age of Onset  . Hypertension Mother   . Hypertension Paternal Grandmother   . Hypertension Paternal Grandfather     Allergies: Patient has no known allergies. Current Outpatient Prescriptions on File Prior to Visit  Medication Sig Dispense Refill  . escitalopram (LEXAPRO) 5 MG tablet TAKE 1 TABLET (5 MG TOTAL) BY MOUTH DAILY. 90 tablet 1  . traZODone (DESYREL) 100 MG tablet Take 1 tablet (100 mg total) by mouth at bedtime. 90 tablet 1   No current facility-administered medications on file prior to visit.     Social History  Substance Use Topics  . Smoking status: Former Smoker    Quit date: 06/18/2012  . Smokeless tobacco: Never Used  . Alcohol use No    Review of Systems  Constitutional: Negative for chills and fever.  HENT: Positive for congestion, ear pain and rhinorrhea. Negative for sinus pain.   Respiratory: Negative for cough.   Cardiovascular: Negative for chest pain and palpitations.  Gastrointestinal: Negative for nausea and vomiting.      Objective:    BP 98/72 (BP Location: Left Arm, Patient Position: Sitting, Cuff Size: Large)   Pulse 97   Temp 98.5 F (36.9 C) (Oral)   Ht 5' 2.75" (1.594 m)   Wt 184 lb 12.8 oz (83.8 kg)   SpO2 94%   BMI 33.00 kg/m  BP Readings from Last 3 Encounters:  02/20/17 98/72  01/16/17 96/66  11/01/16 118/78   Wt Readings from Last 3  Encounters:  02/20/17 184 lb 12.8 oz (83.8 kg)  01/16/17 191 lb (86.6 kg)  11/01/16 195 lb 9.6 oz (88.7 kg)    Physical Exam  Constitutional: She appears well-developed and well-nourished.  HENT:  Head: Normocephalic and atraumatic.  Right Ear: Hearing, tympanic membrane, external ear and ear canal normal. No drainage, swelling or tenderness. No foreign bodies. Tympanic membrane is not erythematous and not bulging. No middle ear effusion. No decreased hearing is noted.  Left Ear: Hearing, tympanic membrane, external ear and ear canal normal. No drainage, swelling or tenderness. No foreign bodies. Tympanic membrane is not erythematous and not bulging.  No middle ear effusion. No decreased hearing is noted.  Nose: Nose normal. No rhinorrhea. Right sinus exhibits no maxillary sinus tenderness and no frontal sinus tenderness. Left sinus exhibits no maxillary sinus tenderness and no frontal sinus tenderness.  Mouth/Throat: Uvula is midline, oropharynx is clear and moist and mucous membranes are normal. No oropharyngeal exudate, posterior oropharyngeal edema, posterior oropharyngeal erythema or tonsillar abscesses.  Eyes: Conjunctivae are normal.  Cardiovascular: Regular rhythm, normal heart sounds and normal pulses.   Pulmonary/Chest: Effort normal and breath sounds normal. She has no wheezes. She has no rhonchi. She has no rales.  Genitourinary: Cervix exhibits no motion tenderness, no discharge and no friability. Right adnexum displays no mass, no tenderness and no fullness. Left adnexum  displays no mass, no tenderness and no fullness. No erythema, tenderness or bleeding in the vagina. No foreign body in the vagina. No signs of injury around the vagina. No vaginal discharge found.  Genitourinary Comments: Pap performed  Lymphadenopathy:       Head (right side): No submental, no submandibular, no tonsillar, no preauricular, no posterior auricular and no occipital adenopathy present.       Head (left  side): No submental, no submandibular, no tonsillar, no preauricular, no posterior auricular and no occipital adenopathy present.    She has no cervical adenopathy.  Neurological: She is alert.  Skin: Skin is warm and dry.  Psychiatric: She has a normal mood and affect. Her speech is normal and behavior is normal. Thought content normal.  Vitals reviewed.      Assessment & Plan:   1. Cervical cancer screening  - Cytology - PAP  2. Nasal congestion Afebrile. Advised conservative therapy with antihistamine. Patient will let me know if not better.  I have discontinued Ms. Agrusa's predniSONE. I am also having her maintain her escitalopram and traZODone.   No orders of the defined types were placed in this encounter.   Return precautions given.   Risks, benefits, and alternatives of the medications and treatment plan prescribed today were discussed, and patient expressed understanding.   Education regarding symptom management and diagnosis given to patient on AVS.  Continue to follow with Mable Paris, FNP for routine health maintenance.   Elton Sin and I agreed with plan.   Mable Paris, FNP

## 2017-02-20 NOTE — Patient Instructions (Signed)
Trial zyrtec for nasal congestion Let me know if not better  Pleasure seeing you

## 2017-02-22 LAB — CYTOLOGY - PAP
Diagnosis: NEGATIVE
HPV (WINDOPATH): NOT DETECTED

## 2017-03-06 ENCOUNTER — Other Ambulatory Visit: Payer: Self-pay | Admitting: Family

## 2017-03-06 ENCOUNTER — Ambulatory Visit
Admission: RE | Admit: 2017-03-06 | Discharge: 2017-03-06 | Disposition: A | Discharge status: Home / Self Care | Payer: 59 | Source: Ambulatory Visit | Attending: Family | Admitting: Family

## 2017-03-06 DIAGNOSIS — Z1231 Encounter for screening mammogram for malignant neoplasm of breast: Secondary | ICD-10-CM | POA: Diagnosis not present

## 2017-03-06 DIAGNOSIS — Z0000 Encounter for general adult medical examination without abnormal findings: Secondary | ICD-10-CM

## 2017-05-26 ENCOUNTER — Other Ambulatory Visit: Payer: Self-pay

## 2017-05-26 DIAGNOSIS — F411 Generalized anxiety disorder: Secondary | ICD-10-CM

## 2017-05-26 MED ORDER — TRAZODONE HCL 100 MG PO TABS
100.0000 mg | ORAL_TABLET | Freq: Every day | ORAL | 1 refills | Status: DC
Start: 2017-05-26 — End: 2017-08-07

## 2017-05-26 NOTE — Telephone Encounter (Signed)
Medication has been refilled.

## 2017-05-31 ENCOUNTER — Telehealth: Payer: Self-pay | Admitting: *Deleted

## 2017-05-31 NOTE — Telephone Encounter (Signed)
Ok to do so

## 2017-05-31 NOTE — Telephone Encounter (Signed)
[facility] requested a medication refill (new script) for Lexapro  Contact [phone] Ext [phone] Fax [phone]

## 2017-06-01 MED ORDER — ESCITALOPRAM OXALATE 5 MG PO TABS: ORAL_TABLET | ORAL | 1 refills | Status: DC

## 2017-06-01 NOTE — Telephone Encounter (Signed)
yes

## 2017-06-01 NOTE — Telephone Encounter (Signed)
Medication has been refilled.

## 2017-06-02 MED ORDER — ESCITALOPRAM OXALATE 5 MG PO TABS: ORAL_TABLET | ORAL | 1 refills | Status: DC

## 2017-06-02 NOTE — Telephone Encounter (Signed)
Pt called and stated that the pharmacy cancelled the rx and needs a new one faxed. Please advise, thank you!  Fax # [phone]  Call pt @ [phone]

## 2017-06-02 NOTE — Addendum Note (Signed)
Addended byElpidio Galea T on: 06/02/2017 04:20 PM   Modules accepted: Orders

## 2017-06-02 NOTE — Telephone Encounter (Signed)
Medication has been placed in provider box for signature and faxed as per patient request.

## 2017-07-24 ENCOUNTER — Other Ambulatory Visit: Payer: Self-pay

## 2017-07-24 ENCOUNTER — Telehealth: Payer: Self-pay

## 2017-07-24 MED ORDER — ESCITALOPRAM OXALATE 5 MG PO TABS: ORAL_TABLET | ORAL | 1 refills | Status: DC

## 2017-07-24 NOTE — Telephone Encounter (Signed)
Refilled and changed pharmacy as per request.

## 2017-08-04 ENCOUNTER — Other Ambulatory Visit: Payer: Self-pay | Admitting: Family

## 2017-08-04 DIAGNOSIS — F411 Generalized anxiety disorder: Secondary | ICD-10-CM

## 2017-08-04 NOTE — Telephone Encounter (Signed)
Pharmacy called requesting a refill on pt's traZODone (DESYREL) 100 MG tablet. Please advise, thank you!

## 2017-08-07 MED ORDER — TRAZODONE HCL 100 MG PO TABS: 100.0000 mg | ORAL_TABLET | Freq: Every day | ORAL | 1 refills | Status: DC

## 2017-09-01 NOTE — Telephone Encounter (Signed)
This has already been filled. In August.

## 2017-09-01 NOTE — Telephone Encounter (Signed)
Pt mail order pharmacy called to follow up on Rx. [place], [address] [place]. They stated that sent a Rx request on 08/28/2017. Please advise?

## 2017-09-12 ENCOUNTER — Encounter: Payer: Self-pay | Admitting: Family

## 2017-09-12 ENCOUNTER — Ambulatory Visit (INDEPENDENT_AMBULATORY_CARE_PROVIDER_SITE_OTHER): Payer: 59 | Admitting: Family

## 2017-09-12 VITALS — BP 112/68 | HR 88 | Temp 98.3°F | Ht 62.75 in | Wt 186.6 lb

## 2017-09-12 DIAGNOSIS — Z23 Encounter for immunization: Secondary | ICD-10-CM | POA: Diagnosis not present

## 2017-09-12 DIAGNOSIS — R42 Dizziness and giddiness: Secondary | ICD-10-CM

## 2017-09-12 DIAGNOSIS — F411 Generalized anxiety disorder: Secondary | ICD-10-CM | POA: Diagnosis not present

## 2017-09-12 MED ORDER — ESCITALOPRAM OXALATE 10 MG PO TABS: ORAL_TABLET | ORAL | 1 refills | Status: DC

## 2017-09-12 NOTE — Patient Instructions (Addendum)
Trial of lexapro 10mg   Also try antihistamine - claritin or zyrtec to see if helps with dizziness, itching in ears.   Borderline orthostatic hypotension- plenty of fluids as you do and also careful when changing positions.   If no improvement of dizziness or new symptoms develop, please let me know immediately.    Orthostatic Hypotension Orthostatic hypotension is a sudden drop in blood pressure that happens when you quickly change positions, such as when you get up from a seated or lying position. Blood pressure is a measurement of how strongly, or weakly, your blood is pressing against the walls of your arteries. Arteries are blood vessels that carry blood from your heart throughout your body. When blood pressure is too low, you may not get enough blood to your brain or to the rest of your organs. This can cause weakness, light-headedness, rapid heartbeat, and fainting. This can last for just a few seconds or for up to a few minutes. Orthostatic hypotension is usually not a serious problem. However, if it happens frequently or gets worse, it may be a sign of something more serious. What are the causes? This condition may be caused by:  Sudden changes in posture, such as standing up quickly after you have been sitting or lying down.  Blood loss.  Loss of body fluids (dehydration).  Heart problems.  Hormone (endocrine) problems.  Pregnancy.  Severe infection.  Lack of certain nutrients.  Severe allergic reactions (anaphylaxis).  Certain medicines, such as blood pressure medicine or medicines that make the body lose excess fluids (diuretics). Sometimes, this condition can be caused by not taking medicine as directed, such as taking too much of a certain medicine.  What increases the risk? Certain factors can make you more likely to develop orthostatic hypotension, including:  Age. Risk increases as you get older.  Conditions that affect the heart or the central nervous  system.  Taking certain medicines, such as blood pressure medicine or diuretics.  Being pregnant.  What are the signs or symptoms? Symptoms of this condition may include:  Weakness.  Light-headedness.  Dizziness.  Blurred vision.  Fatigue.  Rapid heartbeat.  Fainting, in severe cases.  How is this diagnosed? This condition is diagnosed based on:  Your medical history.  Your symptoms.  Your blood pressure measurement. Your health care provider will check your blood pressure when you are: ? Lying down. ? Sitting. ? Standing.  A blood pressure reading is recorded as two numbers, such as "120 over 80" (or 120/80). The first ("top") number is called the systolic pressure. It is a measure of the pressure in your arteries as your heart beats. The second ("bottom") number is called the diastolic pressure. It is a measure of the pressure in your arteries when your heart relaxes between beats. Blood pressure is measured in a unit called mm Hg. Healthy blood pressure for adults is 120/80. If your blood pressure is below 90/60, you may be diagnosed with hypotension. Other information or tests that may be used to diagnose orthostatic hypotension include:  Your other vital signs, such as your heart rate and temperature.  Blood tests.  Tilt table test. For this test, you will be safely secured to a table that moves you from a lying position to an upright position. Your heart rhythm and blood pressure will be monitored during the test.  How is this treated? Treatment for this condition may include:  Changing your diet. This may involve eating more salt (sodium) or drinking more  water.  Taking medicines to raise your blood pressure.  Changing the dosage of certain medicines you are taking that might be lowering your blood pressure.  Wearing compression stockings. These stockings help to prevent blood clots and reduce swelling in your legs.  In some cases, you may need to go to  the hospital for:  Fluid replacement. This means you will receive fluids through an IV tube.  Blood replacement. This means you will receive donated blood through an IV tube (transfusion).  Treating an infection or heart problems, if this applies.  Monitoring. You may need to be monitored while medicines that you are taking wear off.  Follow these instructions at home: Eating and drinking   Drink enough fluid to keep your urine clear or pale yellow.  Eat a healthy diet and follow instructions from your health care provider about eating or drinking restrictions. A healthy diet includes: ? Fresh fruits and vegetables. ? Whole grains. ? Lean meats. ? Low-fat dairy products.  Eat extra salt only as directed. Do not add extra salt to your diet unless your health care provider told you to do that.  Eat frequent, small meals.  Avoid standing up suddenly after eating. Medicines  Take over-the-counter and prescription medicines only as told by your health care provider. ? Follow instructions from your health care provider about changing the dosage of your current medicines, if this applies. ? Do not stop or adjust any of your medicines on your own. General instructions  Wear compression stockings as told by your health care provider.  Get up slowly from lying down or sitting positions. This gives your blood pressure a chance to adjust.  Avoid hot showers and excessive heat as directed by your health care provider.  Return to your normal activities as told by your health care provider. Ask your health care provider what activities are safe for you.  Do not use any products that contain nicotine or tobacco, such as cigarettes and e-cigarettes. If you need help quitting, ask your health care provider.  Keep all follow-up visits as told by your health care provider. This is important. Contact a health care provider if:  You vomit.  You have diarrhea.  You have a fever for more  than 2-3 days.  You feel more thirsty than usual.  You feel weak and tired. Get help right away if:  You have chest pain.  You have a fast or irregular heartbeat.  You develop numbness in any part of your body.  You cannot move your arms or your legs.  You have trouble speaking.  You become sweaty or feel lightheaded.  You faint.  You feel short of breath.  You have trouble staying awake.  You feel confused. This information is not intended to replace advice given to you by your health care provider. Make sure you discuss any questions you have with your health care provider. Document Released: 11/25/2002 Document Revised: 08/23/2016 Document Reviewed: 05/27/2016 Elsevier Interactive Patient Education  2018 [employer].

## 2017-09-12 NOTE — Progress Notes (Signed)
Subjective:    Patient ID: Jodi Hawkins, female    DOB: 1971/11/19, 46 y.o.   MRN: [id number]  CC: Jodi Hawkins is a 46 y.o. female who presents today for follow up.   HPI: GAD- currently on Lexapro and trazodone works well.   CC: 'dizzy spells', past several weeks,  happening weekly. Happens when still and also with movement. No episodes when working out.  Resolves when calms down, relaxes.  No vision changes, HA, syncope, hearing loss, ear pain, vertigo, shakiness, sweatiness, cp, sob.  Endorses pressure in ears. No seasonal allergies. Hasn't tried any medications.  One 16 oz cup of coffee, plenty of water.  Husbands thinks its anxiety and depression. 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  Feel more sluggish.     HISTORY:  Past Medical History:  Diagnosis Date  . Anxiety   . Migraines    No past surgical history on file. Family History  Problem Relation Age of Onset  . Hypertension Mother   . Hypertension Paternal Grandmother   . Hypertension Paternal Grandfather   . Breast cancer Neg Hx     Allergies: Patient has no known allergies. Current Outpatient Prescriptions on File Prior to Visit  Medication Sig Dispense Refill  . traZODone (DESYREL) 100 MG tablet Take 1 tablet (100 mg total) by mouth at bedtime. 90 tablet 1   No current facility-administered medications on file prior to visit.     Social History  Substance Use Topics  . Smoking status: Former Smoker    Quit date: 06/18/2012  . Smokeless tobacco: Never Used  . Alcohol use No    Review of Systems  Constitutional: Negative for chills and fever.  HENT: Positive for ear pain. Negative for congestion, postnasal drip, sinus pressure and sore throat.   Eyes: Negative for visual disturbance.  Respiratory: Negative for cough and shortness of breath.   Cardiovascular: Negative for chest pain and palpitations.  Gastrointestinal: Negative for nausea and vomiting.  Neurological: Positive for dizziness.  Negative for seizures, syncope and weakness.  Psychiatric/Behavioral: The patient is nervous/anxious.       Objective:    BP 112/68   Pulse 88   Temp 98.3 F (36.8 C) (Oral)   Ht 5' 2.75" (1.594 m)   Wt 186 lb 9.6 oz (84.6 kg)   SpO2 95%   BMI 33.32 kg/m  BP Readings from Last 3 Encounters:  09/12/17 112/68  02/20/17 98/72  01/16/17 96/66   Wt Readings from Last 3 Encounters:  09/12/17 186 lb 9.6 oz (84.6 kg)  02/20/17 184 lb 12.8 oz (83.8 kg)  01/16/17 191 lb (86.6 kg)    Physical Exam  Constitutional: She appears well-developed and well-nourished.  HENT:  Head: Normocephalic and atraumatic.  Right Ear: Hearing, tympanic membrane, external ear and ear canal normal. No swelling or tenderness. Tympanic membrane is not erythematous and not bulging. No middle ear effusion.  Left Ear: Tympanic membrane, external ear and ear canal normal. No swelling or tenderness. Tympanic membrane is not erythematous and not bulging.  No middle ear effusion.  Nose: Nose normal. No rhinorrhea. Right sinus exhibits no maxillary sinus tenderness and no frontal sinus tenderness. Left sinus exhibits no maxillary sinus tenderness and no frontal sinus tenderness.  Mouth/Throat: Uvula is midline, oropharynx is clear and moist and mucous membranes are normal. No posterior oropharyngeal edema or posterior oropharyngeal erythema.  Eyes: Pupils are equal, round, and reactive to light. Conjunctivae, EOM and lids are normal. Lids are everted and  swept, no foreign bodies found.  Normal fundus bilaterally   Cardiovascular: Normal rate, regular rhythm, normal heart sounds and normal pulses.   Pulmonary/Chest: Effort normal and breath sounds normal. She has no wheezes. She has no rhonchi. She has no rales.  Lymphadenopathy:       Head (right side): No submental, no submandibular, no tonsillar, no preauricular, no posterior auricular and no occipital adenopathy present.       Head (left side): No submental, no  submandibular, no tonsillar, no preauricular, no posterior auricular and no occipital adenopathy present.    She has no cervical adenopathy.       Right cervical: No superficial cervical, no deep cervical and no posterior cervical adenopathy present.      Left cervical: No superficial cervical, no deep cervical and no posterior cervical adenopathy present.  Neurological: She is alert. She has normal strength. No cranial nerve deficit or sensory deficit. She displays a negative Romberg sign.  Reflex Scores:      Bicep reflexes are 2+ on the right side and 2+ on the left side.      Patellar reflexes are 2+ on the right side and 2+ on the left side. Grip equal and strong bilateral upper extremities. Gait strong and steady. Able to perform  finger-to-nose without difficulty.   Skin: Skin is warm and dry.  Psychiatric: She has a normal mood and affect. Her speech is normal and behavior is normal. Thought content normal.  Vitals reviewed.      Assessment & Plan:   Problem List Items Addressed This Visit      Other   Generalized anxiety disorder - Primary    Worsening of late due to new work role. We'll increase Lexapro 10 mg. Patient will let me know if she is not better.      Relevant Medications   escitalopram (LEXAPRO) 10 MG tablet   Dizziness    Etiology of dizziness nonspecific at this time. Differentials include eustachian tube dysfunction, borderline orthostatic hypotension ( see flow sheet). Reassured by normal neurologic exam. Advised patient to trial antihistamine due to ear pressure she was helps improve symptoms. Education provided regarding orthostatic thypotension. Advised patient to stay vigilant and let me know if any new or worsening symptoms. Patient verbalized understanding.       Other Visit Diagnoses    Need for immunization against influenza       Relevant Orders   Flu Vaccine QUAD 36+ mos IM (Completed)       I have changed Ms. Konicek's escitalopram. I am also  having her maintain her traZODone.   Meds ordered this encounter  Medications  . escitalopram (LEXAPRO) 10 MG tablet    Sig: TAKE 1 TABLET (5 MG TOTAL) BY MOUTH DAILY.    Dispense:  90 tablet    Refill:  1    Order Specific Question:   Supervising Provider    Answer:   Crecencio Mc [[id number]]    Return precautions given.   Risks, benefits, and alternatives of the medications and treatment plan prescribed today were discussed, and patient expressed understanding.   Education regarding symptom management and diagnosis given to patient on AVS.  Continue to follow with Burnard Hawthorne, FNP for routine health maintenance.   Elton Sin and I agreed with plan.   Mable Paris, FNP

## 2017-09-12 NOTE — Assessment & Plan Note (Addendum)
Etiology of dizziness nonspecific at this time. Differentials include eustachian tube dysfunction, borderline orthostatic hypotension ( see flow sheet). Reassured by normal neurologic exam. Advised patient to trial antihistamine due to ear pressure she was helps improve symptoms. Education provided regarding orthostatic thypotension. Advised patient to stay vigilant and let me know if any new or worsening symptoms. Patient verbalized understanding.

## 2017-09-12 NOTE — Assessment & Plan Note (Signed)
Worsening of late due to new work role. We'll increase Lexapro 10 mg. Patient will let me know if she is not better.

## 2017-11-15 ENCOUNTER — Ambulatory Visit: Payer: Self-pay

## 2017-11-15 NOTE — Telephone Encounter (Signed)
  Reason for Disposition . Urinating more frequently than usual (i.e., frequency)  Answer Assessment - Initial Assessment Questions 1. SYMPTOM: "What's the main symptom you're concerned about?" (e.g., frequency, incontinence)     Pain over bladder area and feeling of fullness 2. ONSET: "When did the  ________  start?"     Started Sunday 3. PAIN: "Is there any pain?" If so, ask: "How bad is it?" (Scale: 1-10; mild, moderate, severe)     6-7 4. CAUSE: "What do you think is causing the symptoms?"     Bladder infection 5. OTHER SYMPTOMS: "Do you have any other symptoms?" (e.g., fever, flank pain, blood in urine, pain with urination)     Feels she is not emtpying well 6. PREGNANCY: "Is there any chance you are pregnant?" "When was your last menstrual period?"      No  Protocols used: URINARY [facility] Offered sooner appointment in a [place] office, but states her time is limited. Will call if symptoms worsen before appointment.

## 2017-11-17 ENCOUNTER — Encounter: Payer: Self-pay | Admitting: Family Medicine

## 2017-11-17 ENCOUNTER — Ambulatory Visit: Payer: 59 | Admitting: Family Medicine

## 2017-11-17 VITALS — BP 118/76 | HR 91 | Temp 98.0°F | Wt 190.0 lb

## 2017-11-17 DIAGNOSIS — R1030 Lower abdominal pain, unspecified: Secondary | ICD-10-CM

## 2017-11-17 DIAGNOSIS — N898 Other specified noninflammatory disorders of vagina: Secondary | ICD-10-CM | POA: Diagnosis not present

## 2017-11-17 LAB — POCT URINALYSIS DIPSTICK
BILIRUBIN UA: NEGATIVE
GLUCOSE UA: NEGATIVE
Ketones, UA: NEGATIVE
Leukocytes, UA: NEGATIVE
Nitrite, UA: NEGATIVE
PH UA: 6 (ref 5.0–8.0)
Protein, UA: NEGATIVE
RBC UA: NEGATIVE
Spec Grav, UA: 1.01 (ref 1.010–1.025)
Urobilinogen, UA: 0.2 E.U./dL

## 2017-11-17 NOTE — Progress Notes (Signed)
Subjective:    Patient ID: Jodi Hawkins, female    DOB: October 20, 1971, 46 y.o.   MRN: [id number]  HPI This is a 46 yo female who presents today with lower abdominal pain x 6 days that is sometimes worse than other times. She has noticed a clear sticky vaginal discharge, usually she has no discharge and is dry. No vaginal bleeding. Has sensation of needing to urinate, no dysuria, no nocturia. No nausea or vomiting, no bowel changes, no blood or mucus in bowel movements. No fever.  Has not taken any medication for her symptoms. Feels crampy like period cramps. Is post menopausal.    Past Medical History:  Diagnosis Date  . Anxiety   . Migraines    No past surgical history on file. Family History  Problem Relation Age of Onset  . Hypertension Mother   . Hypertension Paternal Grandmother   . Hypertension Paternal Grandfather   . Breast cancer Neg Hx    Social History   Tobacco Use  . Smoking status: Former Smoker    Last attempt to quit: 06/18/2012    Years since quitting: 5.4  . Smokeless tobacco: Never Used  Substance Use Topics  . Alcohol use: No    Alcohol/week: 0.0 oz  . Drug use: No      Review of Systems Per HPI    Objective:   Physical Exam  Constitutional: She is oriented to person, place, and time. She appears well-developed and well-nourished. No distress.  HENT:  Head: Normocephalic and atraumatic.  Eyes: Conjunctivae are normal.  Cardiovascular: Normal rate, regular rhythm and normal heart sounds.  Pulmonary/Chest: Effort normal and breath sounds normal.  Abdominal: Soft. Bowel sounds are normal. She exhibits no distension. There is no tenderness. There is no rebound and no guarding.  Genitourinary:  Genitourinary Comments: Wet prep obtained, visible, thick, clear vaginal discharge noted externally.   Neurological: She is alert and oriented to person, place, and time.  Skin: Skin is warm and dry. She is not diaphoretic.  Psychiatric: She has a normal mood  and affect. Her behavior is normal. Judgment and thought content normal.  Vitals reviewed.     BP 118/76 (BP Location: Right Arm, Patient Position: Sitting, Cuff Size: Normal)   Pulse 91   Temp 98 F (36.7 C) (Oral)   Wt 190 lb (86.2 kg)   SpO2 96%   BMI 33.93 kg/m  Wt Readings from Last 3 Encounters:  11/17/17 190 lb (86.2 kg)  09/12/17 186 lb 9.6 oz (84.6 kg)  02/20/17 184 lb 12.8 oz (83.8 kg)   ` Results for orders placed or performed in visit on 11/17/17  WET PREP BY MOLECULAR PROBE  Result Value Ref Range   MICRO NUMBER: [id number]    SPECIMEN QUALITY: ADEQUATE    SOURCE: NOT GIVEN    STATUS: FINAL    Trichomonas vaginosis Not Detected    Gardnerella vaginalis Not Detected    Candida species Not Detected   POCT urinalysis dipstick  Result Value Ref Range   Color, UA yellow    Clarity, UA clear    Glucose, UA neg    Bilirubin, UA neg    Ketones, UA neg    Spec Grav, UA 1.010 1.010 - 1.025   Blood, UA neg    pH, UA 6.0 5.0 - 8.0   Protein, UA neg    Urobilinogen, UA 0.2 0.2 or 1.0 E.U./dL   Nitrite, UA neg    Leukocytes, UA Negative Negative  Assessment & Plan:  1. Lower abdominal pain - POCT urinalysis dipstick - WET PREP BY MOLECULAR PROBE - RTC/ER precautions reviewed -OTC NSAIDs prn pain  2. Vaginal discharge - WET PREP BY MOLECULAR PROBE   Clarene Reamer, FNP-BC  [facility], [employer] [facility]  11/20/2017 7:22 AM

## 2017-11-17 NOTE — Patient Instructions (Signed)
I will notify you of wet prep results  If you have fever over 101, worsening pain or vomiting that won't stop, please go to the emergency room

## 2017-11-18 LAB — WET PREP BY MOLECULAR PROBE
Candida species: NOT DETECTED
Gardnerella vaginalis: NOT DETECTED
MICRO NUMBER: 81348290
SPECIMEN QUALITY: ADEQUATE
TRICHOMONAS VAG: NOT DETECTED

## 2017-11-20 ENCOUNTER — Encounter: Payer: Self-pay | Admitting: Family Medicine

## 2018-03-22 ENCOUNTER — Other Ambulatory Visit: Payer: Self-pay | Admitting: Family

## 2018-03-22 DIAGNOSIS — F411 Generalized anxiety disorder: Secondary | ICD-10-CM

## 2018-06-18 ENCOUNTER — Other Ambulatory Visit: Payer: Self-pay | Admitting: Family

## 2018-06-18 DIAGNOSIS — F411 Generalized anxiety disorder: Secondary | ICD-10-CM

## 2018-09-19 ENCOUNTER — Other Ambulatory Visit: Payer: Self-pay | Admitting: Family

## 2018-09-19 DIAGNOSIS — F411 Generalized anxiety disorder: Secondary | ICD-10-CM

## 2018-12-15 ENCOUNTER — Other Ambulatory Visit: Payer: Self-pay | Admitting: Family

## 2018-12-15 DIAGNOSIS — F411 Generalized anxiety disorder: Secondary | ICD-10-CM

## 2018-12-31 ENCOUNTER — Other Ambulatory Visit: Payer: Self-pay | Admitting: Family

## 2018-12-31 ENCOUNTER — Encounter: Payer: Self-pay | Admitting: Family

## 2018-12-31 ENCOUNTER — Ambulatory Visit (INDEPENDENT_AMBULATORY_CARE_PROVIDER_SITE_OTHER): Payer: 59 | Admitting: Family

## 2018-12-31 VITALS — BP 104/68 | HR 108 | Temp 98.7°F | Ht 65.75 in | Wt 188.0 lb

## 2018-12-31 DIAGNOSIS — G4700 Insomnia, unspecified: Secondary | ICD-10-CM

## 2018-12-31 DIAGNOSIS — Z0000 Encounter for general adult medical examination without abnormal findings: Secondary | ICD-10-CM

## 2018-12-31 DIAGNOSIS — F411 Generalized anxiety disorder: Secondary | ICD-10-CM

## 2018-12-31 MED ORDER — ALPRAZOLAM 0.5 MG PO TABS: 0.5000 mg | ORAL_TABLET | Freq: Once | ORAL | 0 refills | Status: AC

## 2018-12-31 MED ORDER — TRAZODONE HCL 100 MG PO TABS: 50.0000 mg | ORAL_TABLET | Freq: Every day | ORAL | 0 refills | Status: DC

## 2018-12-31 NOTE — Assessment & Plan Note (Signed)
Well-controlled on Lexapro, will continue. For  PRN use prior dental appointment given 2 tablets of Xanax. I looked up patient on [place] Controlled Substances Reporting System and saw no activity that raised concern of inappropriate use.

## 2018-12-31 NOTE — Assessment & Plan Note (Signed)
Clinical breast exam performed today.  Deferred pelvic exam the absence of complaints, Pap smear is also up-to-date.  Mammogram has been ordered, patient understands to schedule this.  Patient will return for labs.

## 2018-12-31 NOTE — Patient Instructions (Signed)
Fasting  Labs when you can -please schedule at check out.  We placed a referral for mammogram this year. I asked that you call one the below locations and schedule this when it is convenient for you.   As discussed, I would like you to ask for 3D mammogram over the traditional 2D mammogram as new evidence suggest 3D is superior.   Please note that NOT all insurance companies cover 3D and you may have to pay a higher copay. You may call your insurance company to further clarify your benefits.   Options for [place]  [place], [place]  * [facility] [place], [place] * Note if you ask for 3D mammogram at this location, you must request [place], [place] location*   Health Maintenance for Postmenopausal Women Menopause is a normal process in which your reproductive ability comes to an end. This process happens gradually over a span of months to years, usually between the ages of 54 and 22. Menopause is complete when you have missed 12 consecutive menstrual periods. It is important to talk with your health care provider about some of the most common conditions that affect postmenopausal women, such as heart disease, cancer, and bone loss (osteoporosis). Adopting a healthy lifestyle and getting preventive care can help to promote your health and wellness. Those actions can also lower your chances of developing some of these common conditions. What should I know about menopause? During menopause, you may experience a number of symptoms, such as:  Moderate-to-severe hot flashes.  Night sweats.  Decrease in sex drive.  Mood swings.  Headaches.  Tiredness.  Irritability.  Memory problems.  Insomnia. Choosing to treat or not to treat menopausal changes is an individual decision that you make with your health care provider. What  should I know about hormone replacement therapy and supplements? Hormone therapy products are effective for treating symptoms that are associated with menopause, such as hot flashes and night sweats. Hormone replacement carries certain risks, especially as you become older. If you are thinking about using estrogen or estrogen with progestin treatments, discuss the benefits and risks with your health care provider. What should I know about heart disease and stroke? Heart disease, heart attack, and stroke become more likely as you age. This may be due, in part, to the hormonal changes that your body experiences during menopause. These can affect how your body processes dietary fats, triglycerides, and cholesterol. Heart attack and stroke are both medical emergencies. There are many things that you can do to help prevent heart disease and stroke:  Have your blood pressure checked at least every 1-2 years. High blood pressure causes heart disease and increases the risk of stroke.  If you are 70-22 years old, ask your health care provider if you should take aspirin to prevent a heart attack or a stroke.  Do not use any tobacco products, including cigarettes, chewing tobacco, or electronic cigarettes. If you need help quitting, ask your health care provider.  It is important to eat a healthy diet and maintain a healthy weight. ? Be sure to include plenty of vegetables, fruits, low-fat dairy products, and lean protein. ? Avoid eating foods that are high in solid fats, added sugars, or salt (sodium).  Get regular exercise. This is one of the most important things that you can do for your health. ? Try  to exercise for at least 150 minutes each week. The type of exercise that you do should increase your heart rate and make you sweat. This is known as moderate-intensity exercise. ? Try to do strengthening exercises at least twice each week. Do these in addition to the moderate-intensity exercise.  Know your  numbers.Ask your health care provider to check your cholesterol and your blood glucose. Continue to have your blood tested as directed by your health care provider.  What should I know about cancer screening? There are several types of cancer. Take the following steps to reduce your risk and to catch any cancer development as early as possible. Breast Cancer  Practice breast self-awareness. ? This means understanding how your breasts normally appear and feel. ? It also means doing regular breast self-exams. Let your health care provider know about any changes, no matter how small.  If you are 58 or older, have a clinician do a breast exam (clinical breast exam or CBE) every year. Depending on your age, family history, and medical history, it may be recommended that you also have a yearly breast X-ray (mammogram).  If you have a family history of breast cancer, talk with your health care provider about genetic screening.  If you are at high risk for breast cancer, talk with your health care provider about having an MRI and a mammogram every year.  Breast cancer (BRCA) gene test is recommended for women who have family members with BRCA-related cancers. Results of the assessment will determine the need for genetic counseling and BRCA1 and for BRCA2 testing. BRCA-related cancers include these types: ? Breast. This occurs in males or females. ? Ovarian. ? Tubal. This may also be called fallopian tube cancer. ? Cancer of the abdominal or pelvic lining (peritoneal cancer). ? Prostate. ? Pancreatic. Cervical, Uterine, and Ovarian Cancer Your health care provider may recommend that you be screened regularly for cancer of the pelvic organs. These include your ovaries, uterus, and vagina. This screening involves a pelvic exam, which includes checking for microscopic changes to the surface of your cervix (Pap test).  For women ages 21-65, health care providers may recommend a pelvic exam and a Pap  test every three years. For women ages 31-65, they may recommend the Pap test and pelvic exam, combined with testing for human papilloma virus (HPV), every five years. Some types of HPV increase your risk of cervical cancer. Testing for HPV may also be done on women of any age who have unclear Pap test results.  Other health care providers may not recommend any screening for nonpregnant women who are considered low risk for pelvic cancer and have no symptoms. Ask your health care provider if a screening pelvic exam is right for you.  If you have had past treatment for cervical cancer or a condition that could lead to cancer, you need Pap tests and screening for cancer for at least 20 years after your treatment. If Pap tests have been discontinued for you, your risk factors (such as having a new sexual partner) need to be reassessed to determine if you should start having screenings again. Some women have medical problems that increase the chance of getting cervical cancer. In these cases, your health care provider may recommend that you have screening and Pap tests more often.  If you have a family history of uterine cancer or ovarian cancer, talk with your health care provider about genetic screening.  If you have vaginal bleeding after reaching menopause, tell your  health care provider.  There are currently no reliable tests available to screen for ovarian cancer. Lung Cancer Lung cancer screening is recommended for adults 68-52 years old who are at high risk for lung cancer because of a history of smoking. A yearly low-dose CT scan of the lungs is recommended if you:  Currently smoke.  Have a history of at least 30 pack-years of smoking and you currently smoke or have quit within the past 15 years. A pack-year is smoking an average of one pack of cigarettes per day for one year. Yearly screening should:  Continue until it has been 15 years since you quit.  Stop if you develop a health problem  that would prevent you from having lung cancer treatment. Colorectal Cancer  This type of cancer can be detected and can often be prevented.  Routine colorectal cancer screening usually begins at age 27 and continues through age 71.  If you have risk factors for colon cancer, your health care provider may recommend that you be screened at an earlier age.  If you have a family history of colorectal cancer, talk with your health care provider about genetic screening.  Your health care provider may also recommend using home test kits to check for hidden blood in your stool.  A small camera at the end of a tube can be used to examine your colon directly (sigmoidoscopy or colonoscopy). This is done to check for the earliest forms of colorectal cancer.  Direct examination of the colon should be repeated every 5-10 years until age 63. However, if early forms of precancerous polyps or small growths are found or if you have a family history or genetic risk for colorectal cancer, you may need to be screened more often. Skin Cancer  Check your skin from head to toe regularly.  Monitor any moles. Be sure to tell your health care provider: ? About any new moles or changes in moles, especially if there is a change in a mole's shape or color. ? If you have a mole that is larger than the size of a pencil eraser.  If any of your family members has a history of skin cancer, especially at a Scheffel age, talk with your health care provider about genetic screening.  Always use sunscreen. Apply sunscreen liberally and repeatedly throughout the day.  Whenever you are outside, protect yourself by wearing long sleeves, pants, a wide-brimmed hat, and sunglasses. What should I know about osteoporosis? Osteoporosis is a condition in which bone destruction happens more quickly than new bone creation. After menopause, you may be at an increased risk for osteoporosis. To help prevent osteoporosis or the bone fractures  that can happen because of osteoporosis, the following is recommended:  If you are 58-62 years old, get at least 1,000 mg of calcium and at least 600 mg of vitamin D per day.  If you are older than age 35 but younger than age 50, get at least 1,200 mg of calcium and at least 600 mg of vitamin D per day.  If you are older than age 30, get at least 1,200 mg of calcium and at least 800 mg of vitamin D per day. Smoking and excessive alcohol intake increase the risk of osteoporosis. Eat foods that are rich in calcium and vitamin D, and do weight-bearing exercises several times each week as directed by your health care provider. What should I know about how menopause affects my mental health? Depression may occur at any age, but  it is more common as you become older. Common symptoms of depression include:  Low or sad mood.  Changes in sleep patterns.  Changes in appetite or eating patterns.  Feeling an overall lack of motivation or enjoyment of activities that you previously enjoyed.  Frequent crying spells. Talk with your health care provider if you think that you are experiencing depression. What should I know about immunizations? It is important that you get and maintain your immunizations. These include:  Tetanus, diphtheria, and pertussis (Tdap) booster vaccine.  Influenza every year before the flu season begins.  Pneumonia vaccine.  Shingles vaccine. Your health care provider may also recommend other immunizations. This information is not intended to replace advice given to you by your health care provider. Make sure you discuss any questions you have with your health care provider. Document Released: 01/27/2006 Document Revised: 06/24/2016 Document Reviewed: 09/08/2015 Elsevier Interactive Patient Education  2019 [employer].

## 2018-12-31 NOTE — Addendum Note (Signed)
Addended by: Burnard Hawthorne on: 12/31/2018 03:11 PM   Modules accepted: Orders

## 2018-12-31 NOTE — Progress Notes (Signed)
Subjective:    Patient ID: Jodi Hawkins, female    DOB: 07/14/1971, 48 y.o.   MRN: [id number]  CC: Jodi Hawkins is a 48 y.o. female who presents today for physical exam.    HPI: Feels well today, no new complaints.    Has panic attacks at dental office and would like to go to dentist for regular checkup, wondering what she might be able to take prior to appointment.  GAD- well controlled on lexapro. Taking 50mg  trazodone which helps sleep      Colorectal Cancer Screening: No early family history.  Breast Cancer Screening: Mammogram due Cervical Cancer Screening: UTD. No vaginal bleeding.  Bone Health screening/DEXA for 65+: No increased fracture risk. Defer screening at this time. Lung Cancer Screening: Doesn't have 30 year pack year history and age > 80 years       Tetanus - UTD Labs: Screening labs today. Exercise: Gets regular exercise.  Alcohol use: none Smoking/tobacco use: Former smoker.  Regular dental exams: In need of dental exam. Wears seat belt: Yes. Skin: no new lesions.   HISTORY:  Past Medical History:  Diagnosis Date  . Anxiety   . Migraines     History reviewed. No pertinent surgical history. Family History  Problem Relation Age of Onset  . Hypertension Mother   . Hypertension Paternal Grandmother   . Hypertension Paternal Grandfather   . Breast cancer Neg Hx       ALLERGIES: Patient has no known allergies.  Current Outpatient Medications on File Prior to Visit  Medication Sig Dispense Refill  . escitalopram (LEXAPRO) 10 MG tablet TAKE 1 TABLET BY MOUTH EVERY DAY 90 tablet 0   No current facility-administered medications on file prior to visit.     Social History   Tobacco Use  . Smoking status: Former Smoker    Last attempt to quit: 06/18/2012    Years since quitting: 6.5  . Smokeless tobacco: Never Used  Substance Use Topics  . Alcohol use: No    Alcohol/week: 0.0 standard drinks  . Drug use: No    Review of Systems    Constitutional: Negative for chills, fever and unexpected weight change.  HENT: Negative for congestion.   Respiratory: Negative for cough.   Cardiovascular: Negative for chest pain, palpitations and leg swelling.  Gastrointestinal: Negative for nausea and vomiting.  Genitourinary: Negative for hematuria and vaginal bleeding.  Musculoskeletal: Negative for arthralgias and myalgias.  Skin: Negative for rash.  Neurological: Negative for headaches.  Hematological: Negative for adenopathy.  Psychiatric/Behavioral: Negative for confusion and sleep disturbance. The patient is nervous/anxious.       Objective:    BP 104/68 (BP Location: Left Arm, Patient Position: Sitting, Cuff Size: Large)   Pulse (!) 108   Temp 98.7 F (37.1 C)   Ht 5' 5.75" (1.67 m)   Wt 188 lb (85.3 kg)   SpO2 95%   BMI 30.58 kg/m   BP Readings from Last 3 Encounters:  12/31/18 104/68  11/17/17 118/76  09/12/17 112/68   Wt Readings from Last 3 Encounters:  12/31/18 188 lb (85.3 kg)  11/17/17 190 lb (86.2 kg)  09/12/17 186 lb 9.6 oz (84.6 kg)    Physical Exam Vitals signs reviewed.  Constitutional:      Appearance: She is well-developed.  Eyes:     Conjunctiva/sclera: Conjunctivae normal.  Neck:     Thyroid: No thyroid mass or thyromegaly.  Cardiovascular:     Rate and Rhythm: Normal rate and regular rhythm.  Pulses: Normal pulses.     Heart sounds: Normal heart sounds.  Pulmonary:     Effort: Pulmonary effort is normal.     Breath sounds: Normal breath sounds. No wheezing, rhonchi or rales.  Chest:     Breasts: Breasts are symmetrical.        Right: No inverted nipple, mass, nipple discharge, skin change or tenderness.        Left: No inverted nipple, mass, nipple discharge, skin change or tenderness.  Lymphadenopathy:     Head:     Right side of head: No submental, submandibular, tonsillar, preauricular, posterior auricular or occipital adenopathy.     Left side of head: No submental,  submandibular, tonsillar, preauricular, posterior auricular or occipital adenopathy.     Cervical: No cervical adenopathy.     Right cervical: No superficial, deep or posterior cervical adenopathy.    Left cervical: No superficial, deep or posterior cervical adenopathy.  Skin:    General: Skin is warm and dry.  Neurological:     Mental Status: She is alert.  Psychiatric:        Speech: Speech normal.        Behavior: Behavior normal.        Thought Content: Thought content normal.        Assessment & Plan:   Problem List Items Addressed This Visit      Other   Generalized anxiety disorder    Well-controlled on Lexapro, will continue. For  PRN use prior dental appointment given 2 tablets of Xanax. I looked up patient on [place] Controlled Substances Reporting System and saw no activity that raised concern of inappropriate use.        Relevant Medications   ALPRAZolam (XANAX) 0.5 MG tablet   traZODone (DESYREL) 100 MG tablet   Insomnia   Relevant Medications   traZODone (DESYREL) 100 MG tablet   Routine general medical examination at a health care facility - Primary    Clinical breast exam performed today.  Deferred pelvic exam the absence of complaints, Pap smear is also up-to-date.  Mammogram has been ordered, patient understands to schedule this.  Patient will return for labs.      Relevant Orders   MM 3D SCREEN BREAST BILATERAL   Comprehensive metabolic panel   Hemoglobin A1c   Lipid panel   TSH   VITAMIN D 25 Hydroxy (Vit-D Deficiency, Fractures)   B12 and Folate Panel       I have changed [name]'s traZODone. I am also having her start on ALPRAZolam. Additionally, I am having her maintain her escitalopram.   Meds ordered this encounter  Medications  . ALPRAZolam (XANAX) 0.5 MG tablet    Sig: Take 1 tablet (0.5 mg total) by mouth once for 1 dose. Take 15 - 30 minutes prior to appointment time.    Dispense:  2 tablet    Refill:  0    Order Specific  Question:   Supervising Provider    Answer:   Derrel Nip, TERESA L [[id number]]  . traZODone (DESYREL) 100 MG tablet    Sig: Take 0.5 tablets (50 mg total) by mouth at bedtime.    Dispense:  90 tablet    Refill:  0    Order Specific Question:   Supervising Provider    Answer:   Crecencio Mc [[id number]]    Return precautions given.   Risks, benefits, and alternatives of the medications and treatment plan prescribed today were discussed, and patient expressed understanding.  Education regarding symptom management and diagnosis given to patient on AVS.   Continue to follow with Burnard Hawthorne, FNP for routine health maintenance.   Elton Sin and I agreed with plan.   Mable Paris, FNP

## 2019-01-08 ENCOUNTER — Other Ambulatory Visit: Payer: 59

## 2019-01-21 ENCOUNTER — Encounter: Payer: Self-pay | Admitting: Family

## 2019-03-15 ENCOUNTER — Other Ambulatory Visit: Payer: Self-pay | Admitting: Family

## 2019-03-15 DIAGNOSIS — F411 Generalized anxiety disorder: Secondary | ICD-10-CM

## 2019-07-17 ENCOUNTER — Other Ambulatory Visit: Payer: Self-pay | Admitting: Family

## 2019-07-17 DIAGNOSIS — Z1231 Encounter for screening mammogram for malignant neoplasm of breast: Secondary | ICD-10-CM

## 2019-09-12 ENCOUNTER — Other Ambulatory Visit: Payer: Self-pay

## 2019-09-12 DIAGNOSIS — F411 Generalized anxiety disorder: Secondary | ICD-10-CM

## 2019-09-12 DIAGNOSIS — G4700 Insomnia, unspecified: Secondary | ICD-10-CM

## 2019-09-12 MED ORDER — ESCITALOPRAM OXALATE 10 MG PO TABS: 10.0000 mg | ORAL_TABLET | Freq: Every day | ORAL | 0 refills | Status: DC

## 2019-09-12 MED ORDER — TRAZODONE HCL 100 MG PO TABS: 50.0000 mg | ORAL_TABLET | Freq: Every day | ORAL | 0 refills | Status: DC

## 2019-09-20 ENCOUNTER — Other Ambulatory Visit: Payer: Self-pay

## 2019-09-20 DIAGNOSIS — F411 Generalized anxiety disorder: Secondary | ICD-10-CM

## 2019-09-20 MED ORDER — ESCITALOPRAM OXALATE 10 MG PO TABS: 10.0000 mg | ORAL_TABLET | Freq: Every day | ORAL | 0 refills | Status: DC

## 2019-10-01 ENCOUNTER — Other Ambulatory Visit: Payer: Self-pay

## 2019-10-01 DIAGNOSIS — Z20822 Contact with and (suspected) exposure to covid-19: Secondary | ICD-10-CM

## 2019-10-03 LAB — NOVEL CORONAVIRUS, NAA: SARS-CoV-2, NAA: NOT DETECTED

## 2019-11-07 ENCOUNTER — Ambulatory Visit
Admission: RE | Admit: 2019-11-07 | Discharge: 2019-11-07 | Disposition: A | Discharge status: Home / Self Care | Payer: 59 | Source: Ambulatory Visit | Attending: Family | Admitting: Family

## 2019-11-07 DIAGNOSIS — Z1231 Encounter for screening mammogram for malignant neoplasm of breast: Secondary | ICD-10-CM | POA: Diagnosis present

## 2019-12-10 ENCOUNTER — Other Ambulatory Visit: Payer: Self-pay | Admitting: Family

## 2019-12-10 DIAGNOSIS — F411 Generalized anxiety disorder: Secondary | ICD-10-CM

## 2020-01-05 ENCOUNTER — Encounter: Payer: Self-pay | Admitting: Emergency Medicine

## 2020-01-05 ENCOUNTER — Emergency Department
Admission: EM | Admit: 2020-01-05 | Discharge: 2020-01-05 | Disposition: A | Discharge status: Home / Self Care | Payer: 59 | Attending: Emergency Medicine | Admitting: Emergency Medicine

## 2020-01-05 ENCOUNTER — Other Ambulatory Visit: Payer: Self-pay

## 2020-01-05 DIAGNOSIS — M533 Sacrococcygeal disorders, not elsewhere classified: Secondary | ICD-10-CM | POA: Diagnosis not present

## 2020-01-05 DIAGNOSIS — G44209 Tension-type headache, unspecified, not intractable: Secondary | ICD-10-CM | POA: Insufficient documentation

## 2020-01-05 DIAGNOSIS — R110 Nausea: Secondary | ICD-10-CM | POA: Diagnosis not present

## 2020-01-05 DIAGNOSIS — R519 Headache, unspecified: Secondary | ICD-10-CM | POA: Diagnosis present

## 2020-01-05 DIAGNOSIS — Z79899 Other long term (current) drug therapy: Secondary | ICD-10-CM | POA: Diagnosis not present

## 2020-01-05 DIAGNOSIS — Z87891 Personal history of nicotine dependence: Secondary | ICD-10-CM | POA: Insufficient documentation

## 2020-01-05 LAB — COMPREHENSIVE METABOLIC PANEL
ALT: 26 U/L (ref 0–44)
AST: 21 U/L (ref 15–41)
Albumin: 4 g/dL (ref 3.5–5.0)
Alkaline Phosphatase: 66 U/L (ref 38–126)
Anion gap: 7 (ref 5–15)
BUN: 15 mg/dL (ref 6–20)
CO2: 28 mmol/L (ref 22–32)
Calcium: 9.3 mg/dL (ref 8.9–10.3)
Chloride: 105 mmol/L (ref 98–111)
Creatinine, Ser: 0.79 mg/dL (ref 0.44–1.00)
GFR calc Af Amer: >60 mL/min (ref >60)
GFR calc non Af Amer: >60 mL/min (ref >60)
Glucose, Bld: 110 mg/dL — ABNORMAL HIGH (ref 70–99)
Potassium: 3.7 mmol/L (ref 3.5–5.1)
Sodium: 140 mmol/L (ref 135–145)
Total Bilirubin: 0.3 mg/dL (ref 0.3–1.2)
Total Protein: 6.8 g/dL (ref 6.5–8.1)

## 2020-01-05 LAB — CBC WITH DIFFERENTIAL/PLATELET
Abs Immature Granulocytes: 0.03 10*3/uL (ref 0.00–0.07)
Basophils Absolute: 0 10*3/uL (ref 0.0–0.1)
Basophils Relative: 0 %
Eosinophils Absolute: 0 10*3/uL (ref 0.0–0.5)
Eosinophils Relative: 0 %
HCT: 39.4 % (ref 36.0–46.0)
Hemoglobin: 13.1 g/dL (ref 12.0–15.0)
Immature Granulocytes: 0 %
Lymphocytes Relative: 41 %
Lymphs Abs: 2.8 10*3/uL (ref 0.7–4.0)
MCH: 29.6 pg (ref 26.0–34.0)
MCHC: 33.2 g/dL (ref 30.0–36.0)
MCV: 89.1 fL (ref 80.0–100.0)
Monocytes Absolute: 0.4 10*3/uL (ref 0.1–1.0)
Monocytes Relative: 6 %
Neutro Abs: 3.6 10*3/uL (ref 1.7–7.7)
Neutrophils Relative %: 53 %
Platelets: 194 10*3/uL (ref 150–400)
RBC: 4.42 MIL/uL (ref 3.87–5.11)
RDW: 12.6 % (ref 11.5–15.5)
WBC: 6.8 10*3/uL (ref 4.0–10.5)
nRBC: 0 % (ref 0.0–0.2)

## 2020-01-05 MED ORDER — METHOCARBAMOL 500 MG PO TABS: 500.0000 mg | ORAL_TABLET | Freq: Four times a day (QID) | ORAL | 0 refills | Status: DC

## 2020-01-05 MED ORDER — BUTALBITAL-APAP-CAFFEINE 50-325-40 MG PO TABS: 1.0000 | ORAL_TABLET | Freq: Four times a day (QID) | ORAL | 0 refills | Status: DC | PRN

## 2020-01-05 MED ORDER — KETOROLAC TROMETHAMINE 30 MG/ML IJ SOLN
30.0000 mg | Freq: Once | INTRAMUSCULAR | Status: AC
  Administered 2020-01-05: 30 mg via INTRAMUSCULAR
  Filled 2020-01-05: qty 1

## 2020-01-05 MED ORDER — PROMETHAZINE HCL 25 MG/ML IJ SOLN
25.0000 mg | Freq: Once | INTRAMUSCULAR | Status: AC
  Administered 2020-01-05: 25 mg via INTRAMUSCULAR
  Filled 2020-01-05: qty 1

## 2020-01-05 MED ORDER — BUTALBITAL-APAP-CAFFEINE 50-325-40 MG PO TABS: 1.0000 | ORAL_TABLET | Freq: Four times a day (QID) | ORAL | 0 refills | Status: AC | PRN

## 2020-01-05 MED ORDER — MELOXICAM 15 MG PO TABS: 15.0000 mg | ORAL_TABLET | Freq: Every day | ORAL | 0 refills | Status: DC

## 2020-01-05 MED ORDER — DIPHENHYDRAMINE HCL 50 MG/ML IJ SOLN
50.0000 mg | Freq: Once | INTRAMUSCULAR | Status: AC
  Administered 2020-01-05: 50 mg via INTRAMUSCULAR
  Filled 2020-01-05: qty 1

## 2020-01-05 NOTE — ED Provider Notes (Signed)
[facility] Emergency Department Provider Note  ____________________________________________  Time seen: Approximately 2:40 PM  I have reviewed the triage vital signs and the nursing notes.   HISTORY  Chief Complaint Headache    HPI Jodi Hawkins is a 49 y.o. female tension type headache.  Patient presented to emergency department complaining of a occipital headache radiating around behind her eyes.   Patient does have a history of migraines but states this is slightly atypical for her.  Patient has had some nausea, no emesis.  No recent trauma to the head.  No visual changes.  No fevers or chills.  Patient is also complaining of some low back pain.  She states that she has a history of back problems, typically in the lower thoracic and upper lumbar region.  This is described more as "tailbone" pain.  She states that she has been sitting more than normal, as well as recently changing her mattress.  No medications for this complaint prior to arrival.        Past Medical History:  Diagnosis Date  . Anxiety   . Migraines     Patient Active Problem List   Diagnosis Date Noted  . Dizziness 09/12/2017  . Acute dysfunction of left eustachian tube 11/01/2016  . OME (otitis media with effusion) 11/22/2015  . Vitamin D deficiency 11/22/2015  . Routine general medical examination at a health care facility 10/23/2015  . Generalized anxiety disorder 09/11/2015  . Right knee pain 09/11/2015  . Insomnia 09/11/2015    History reviewed. No pertinent surgical history.  Prior to Admission medications   Medication Sig Start Date End Date Taking? Authorizing Provider  butalbital-acetaminophen-caffeine (FIORICET) 50-325-40 MG tablet Take 1 tablet by mouth every 6 (six) hours as needed for headache. 01/05/20 01/04/21  Moksh Loomer, Charline Bills, PA-C  escitalopram (LEXAPRO) 10 MG tablet TAKE 1 TABLET(10 MG) BY MOUTH DAILY 12/10/19   Burnard Hawthorne, FNP  meloxicam (MOBIC)  15 MG tablet Take 1 tablet (15 mg total) by mouth daily. 01/05/20   Jarmarcus Wambold, Charline Bills, PA-C  methocarbamol (ROBAXIN) 500 MG tablet Take 1 tablet (500 mg total) by mouth 4 (four) times daily. 01/05/20   Soriah Leeman, Charline Bills, PA-C  traZODone (DESYREL) 100 MG tablet Take 0.5 tablets (50 mg total) by mouth at bedtime. 09/12/19   Burnard Hawthorne, FNP    Allergies Patient has no known allergies.  Family History  Problem Relation Age of Onset  . Hypertension Mother   . Hypertension Paternal Grandmother   . Hypertension Paternal Grandfather   . Breast cancer Neg Hx     Social History Social History   Tobacco Use  . Smoking status: Former Smoker    Quit date: 06/18/2012    Years since quitting: 7.5  . Smokeless tobacco: Never Used  Substance Use Topics  . Alcohol use: No    Alcohol/week: 0.0 standard drinks  . Drug use: No     Review of Systems  Constitutional: No fever/chills Eyes: No visual changes. No discharge ENT: No upper respiratory complaints. Cardiovascular: no chest pain. Respiratory: no cough. No SOB. Gastrointestinal: No abdominal pain.  No nausea, no vomiting.  No diarrhea.  No constipation. Genitourinary: Negative for dysuria. No hematuria Musculoskeletal: Positive for "tailbone" pain Skin: Negative for rash, abrasions, lacerations, ecchymosis. Neurological: Positive for occipital headache, denies focal weakness or numbness. 10-point ROS otherwise negative.  ____________________________________________   PHYSICAL EXAM:  VITAL SIGNS: ED Triage Vitals  Enc Vitals Group     BP 01/05/20  1240 108/62     Pulse Rate 01/05/20 1240 83     Resp 01/05/20 1240 16     Temp 01/05/20 1240 97.8 F (36.6 C)     Temp Source 01/05/20 1240 Oral     SpO2 01/05/20 1240 98 %     Weight 01/05/20 1240 188 lb 0.8 oz (85.3 kg)     Height --      Head Circumference --      Peak Flow --      Pain Score 01/05/20 1239 9     Pain Loc --      Pain Edu? --      Excl. in [place]? --       Constitutional: Alert and oriented. Well appearing and in no acute distress. Eyes: Conjunctivae are normal. PERRL. EOMI. Head: Atraumatic. ENT:      Ears:       Nose: No congestion/rhinnorhea.      Mouth/Throat: Mucous membranes are moist.  Neck: No stridor.  Neck is supple full range of motion Hematological/Lymphatic/Immunilogical: No cervical lymphadenopathy. Cardiovascular: Normal rate, regular rhythm. Normal S1 and S2.  Good peripheral circulation. Respiratory: Normal respiratory effort without tachypnea or retractions. Lungs CTAB. Good air entry to the bases with no decreased or absent breath sounds. Musculoskeletal: Full range of motion to all extremities. No gross deformities appreciated.  No tenderness to palpation to the thoracic and lumbar spine.  Patient has mild tenderness to palpation over the sacral spine region.  No palpable abnormality or step-off.  No tenderness to palpation over the SI joints.  No tenderness to palpation over sciatic notches.  No soft tissue findings over area of tenderness. Neurologic:  Normal speech and language. No gross focal neurologic deficits are appreciated.  Cranial nerves II through XII grossly intact.  Negative Romberg's and pronator drift. Skin:  Skin is warm, dry and intact. No rash noted. Psychiatric: Mood and affect are normal. Speech and behavior are normal. Patient exhibits appropriate insight and judgement.   ____________________________________________   LABS (all labs ordered are listed, but only abnormal results are displayed)  Labs Reviewed  COMPREHENSIVE METABOLIC PANEL - Abnormal; Notable for the following components:      Result Value   Glucose, Bld 110 (*)    All other components within normal limits  CBC WITH DIFFERENTIAL/PLATELET  URINALYSIS, COMPLETE (UACMP) WITH MICROSCOPIC  POC URINE PREG, ED    ____________________________________________  EKG   ____________________________________________  RADIOLOGY   No results found.  ____________________________________________    PROCEDURES  Procedure(s) performed:    Procedures    Medications  ketorolac (TORADOL) 30 MG/ML injection 30 mg (has no administration in time range)  promethazine (PHENERGAN) injection 25 mg (has no administration in time range)  diphenhydrAMINE (BENADRYL) injection 50 mg (has no administration in time range)     ____________________________________________   INITIAL IMPRESSION / ASSESSMENT AND PLAN / ED COURSE  Pertinent labs & imaging results that were available during my care of the patient were reviewed by me and considered in my medical decision making (see chart for details).  Review of the [place] CSRS was performed in accordance of the [place] prior to dispensing any controlled drugs.  Clinical Course as of Jan 05 1504  Sun Jan 05, 2020  1456 Patient presented to the emergency department complaining of occipital headache, tailbone pain.  Tailbone tenderness has been ongoing times several months.  Patient developed a headache today.  She states that she had nausea but no emesis.  She  does have a history of migraines but states this was different than her typical migraine.  Labs are reassuring at this time with no significant findings.  Patient is neurologically intact on exam.  No significant findings around sacral spine.  I discussed patient physical exam findings, lab results, differential diagnosis with patient.  She opts for migraine cocktail, discharged home at this time.  She does not wish to pursue imaging.  I feel this is reasonable the patient may return anytime for imaging if she so desires.   [JC]    Clinical Course User Index [JC] Emmelina Mcloughlin, Charline Bills, PA-C          Patient's diagnosis is consistent with tension type headache.  Patient presented to emergency department with  complaints consistent with tension type headache.  Overall exam was reassuring.  Patient opted out of imaging at this time.  Labs are reassuring.  Patient given migraine cocktail.  She will be prescribed Fioricet, meloxicam, Robaxin for home use.  She has an appointment with her primary care within the next 2 days to discuss other issues.  Patient should follow-up with her primary care regarding this headache.  Any changes, patient should return to the emergency department..  Patient is given ED precautions to return to the ED for any worsening or new symptoms.     ____________________________________________  FINAL CLINICAL IMPRESSION(S) / ED DIAGNOSES  Final diagnoses:  Acute non intractable tension-type headache  Sacral back pain      NEW MEDICATIONS STARTED DURING THIS VISIT:  ED Discharge Orders         Ordered    butalbital-acetaminophen-caffeine (FIORICET) 50-325-40 MG tablet  Every 6 hours PRN     01/05/20 1504    meloxicam (MOBIC) 15 MG tablet  Daily     01/05/20 1504    methocarbamol (ROBAXIN) 500 MG tablet  4 times daily     01/05/20 1504              This chart was dictated using voice recognition software/Dragon. Despite best efforts to proofread, errors can occur which can change the meaning. Any change was purely unintentional.    Darletta Moll, PA-C 01/05/20 1505    Nance Pear, MD 01/06/20 [phone]

## 2020-01-05 NOTE — ED Triage Notes (Signed)
C/O headache to back of head and nausea x 1 hour.  AAOx3.  Skin warm and dry. MAE equally and strong.  NAD

## 2020-01-15 ENCOUNTER — Ambulatory Visit (INDEPENDENT_AMBULATORY_CARE_PROVIDER_SITE_OTHER): Payer: 59 | Admitting: Family

## 2020-01-15 ENCOUNTER — Encounter: Payer: Self-pay | Admitting: Family

## 2020-01-15 ENCOUNTER — Other Ambulatory Visit: Payer: Self-pay

## 2020-01-15 VITALS — Ht 63.0 in | Wt 190.0 lb

## 2020-01-15 DIAGNOSIS — M533 Sacrococcygeal disorders, not elsewhere classified: Secondary | ICD-10-CM | POA: Diagnosis not present

## 2020-01-15 DIAGNOSIS — R519 Headache, unspecified: Secondary | ICD-10-CM | POA: Diagnosis not present

## 2020-01-15 DIAGNOSIS — G43909 Migraine, unspecified, not intractable, without status migrainosus: Secondary | ICD-10-CM | POA: Insufficient documentation

## 2020-01-15 NOTE — Assessment & Plan Note (Signed)
Reviewed emergency room course with patient.  Certainly in setting of virtual appointment unable to assess her sacrum.  It has been going on for over a year, patient describes as dull , though not particularly bothersome ache . She.  Does not describe any swelling such as to suggest an abscess.  We will start today with XR imaging today of entire spine.  Close follow-up in 6 weeks.  Patient let me know of any concerns prior

## 2020-01-15 NOTE — Progress Notes (Signed)
Virtual Visit via Video Note  I connected with@  on 01/15/20 at  8:00 AM EST by a video enabled telemedicine application and verified that I am speaking with the correct person using two identifiers.  Location patient: home Location provider:work  Persons participating in the virtual visit: patient, provider  I discussed the limitations of evaluation and management by telemedicine and the availability of in person appointments. The patient expressed understanding and agreed to proceed.   HPI:  Chief complaint posterior headache, started 10 days ago. No ha today.  Has had 3 headaches total over the last 10 days.  Had nausea, no vomiting with first HA, however no nausea since.  Was taking a shower and initially had posterior HA pain. It was severe and scared her which is when she went to ED. By the time she arrived at the ED, the pain had subsided.  SHe woke up with HA, 6 days ago, again posterior. She also had a HA 3-4 days ago, which started in the evening when watching TV. This one was also also posterior.    Given fioricet, mobic, and robaxin; has taken all 3 together as 'cocktail'  twice since has been home with resolve. No vision loss, numbness to face, confusion, sinus congestion, nasal congestion, neck pain, numbness coming down arms.     H/o grinding teeth and long h/o headaches. 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 Recently had 3 teeth pulled which has helped with her typical HA which occipital, behind eyes. This is typically triggered by working on computer, poor sleep, weather trigger.    Has had 'tailbone pain' for a year, gradually gotten worse. Dull ache, which 'I can forget about.' Noticed more when siting or presses on it. No pain with walking. Has pillow behind low back as well. Has tried heat, NSAIDs with some relief.  NO swelling, redness over tailbone. NO recent injury. No trouble urinating, numbness to face. No h/o abscess.   She went to the emergency room 1/17/21S for HA and   "tailbone pain".  BP 108/62, 83. She was given Toradol, phenergan. Benadryl with resolve.  ROS: See pertinent positives and negatives per HPI.  Past Medical History:  Diagnosis Date  . Anxiety   . Migraines     History reviewed. No pertinent surgical history.  Family History  Problem Relation Age of Onset  . Hypertension Mother   . Hypertension Paternal Grandmother   . Hypertension Paternal Grandfather   . Breast cancer Neg Hx     SOCIAL HX: Former smoker   Current Outpatient Medications:  .  butalbital-acetaminophen-caffeine (FIORICET) 50-325-40 MG tablet, Take 1 tablet by mouth every 6 (six) hours as needed for headache., Disp: 20 tablet, Rfl: 0 .  escitalopram (LEXAPRO) 10 MG tablet, TAKE 1 TABLET(10 MG) BY MOUTH DAILY, Disp: 30 tablet, Rfl: 1 .  traZODone (DESYREL) 100 MG tablet, Take 0.5 tablets (50 mg total) by mouth at bedtime., Disp: 90 tablet, Rfl: 0 .  meloxicam (MOBIC) 15 MG tablet, Take 1 tablet (15 mg total) by mouth daily. (Patient not taking: Reported on 01/15/2020), Disp: 30 tablet, Rfl: 0 .  methocarbamol (ROBAXIN) 500 MG tablet, Take 1 tablet (500 mg total) by mouth 4 (four) times daily. (Patient not taking: Reported on 01/15/2020), Disp: 16 tablet, Rfl: 0  EXAM:  VITALS per patient if applicable:  GENERAL: alert, oriented, appears well and in no acute distress  HEENT: atraumatic, conjunttiva clear, no obvious abnormalities on inspection of external nose and ears  NECK:  normal movements of the head and neck  LUNGS: on inspection no signs of respiratory distress, breathing rate appears normal, no obvious gross SOB, gasping or wheezing  CV: no obvious cyanosis  MS: moves all visible extremities without noticeable abnormality  PSYCH/NEURO: pleasant and cooperative, no obvious depression or anxiety, speech and thought processing grossly intact  ASSESSMENT AND PLAN:  Discussed the following assessment and plan:  Acute nonintractable headache,  unspecified headache type - Plan: MR Brain W Wo Contrast  Sacral pain - Plan: DG Sacrum/Coccyx, DG Cervical Spine Complete, DG Thoracic Spine W/Swimmers, DG Lumbar Spine Complete Problem List Items Addressed This Visit      Other   Headache - Primary    Etiology of HA is nonspecific at this time.  We discussed potentially this being from a cervical spine issue as she has been spendig more time at her desk.  Patient has had a HA history, however presentation has changed from occipital to posterior.  Currently medication cocktail from emergency room has been effective.  Advised she may continue this as we will schedule an MRI brain. Close follow up.       Relevant Orders   MR Brain W Wo Contrast   Sacral pain    Reviewed emergency room course with patient.  Certainly in setting of virtual appointment unable to assess her sacrum.  It has been going on for over a year, patient describes as dull , though not particularly bothersome ache . She.  Does not describe any swelling such as to suggest an abscess.  We will start today with XR imaging today of entire spine.  Close follow-up in 6 weeks.  Patient let me know of any concerns prior      Relevant Orders   DG Sacrum/Coccyx   DG Cervical Spine Complete   DG Thoracic Spine W/Swimmers   DG Lumbar Spine Complete      -we discussed possible serious and likely etiologies, options for evaluation and workup, limitations of telemedicine visit vs in person visit, treatment, treatment risks and precautions. Pt prefers to treat via telemedicine empirically rather then risking or undertaking an in person visit at this moment. Patient agrees to seek prompt in person care if worsening, new symptoms arise, or if is not improving with treatment.   I discussed the assessment and treatment plan with the patient. The patient was provided an opportunity to ask questions and all were answered. The patient agreed with the plan and demonstrated an understanding of  the instructions.   The patient was advised to call back or seek an in-person evaluation if the symptoms worsen or if the condition fails to improve as anticipated.   Mable Paris, FNP

## 2020-01-15 NOTE — Assessment & Plan Note (Addendum)
Etiology of HA is nonspecific at this time.  We discussed potentially this being from a cervical spine issue as she has been spendig more time at her desk.  Patient has had a HA history, however presentation has changed from occipital to posterior.  Currently medication cocktail from emergency room has been effective.  Advised she may continue this as we will schedule an MRI brain. Close follow up.

## 2020-01-16 ENCOUNTER — Telehealth: Payer: Self-pay | Admitting: Family

## 2020-01-16 NOTE — Telephone Encounter (Signed)
lvm to rtc. No dpr on file.  Jodi Hawkins has ordered a MRI brain for her. This has been scheduled at [facility] for Saturday Feb. 6. She will need to arrive by 10:30 am for check in and her appt will begin at 43. If she needs to reschedule this she can call [phone] option 3, option 2

## 2020-01-25 ENCOUNTER — Ambulatory Visit: Payer: 59

## 2020-02-19 ENCOUNTER — Other Ambulatory Visit: Payer: Self-pay | Admitting: Family

## 2020-02-19 DIAGNOSIS — F411 Generalized anxiety disorder: Secondary | ICD-10-CM

## 2020-02-25 ENCOUNTER — Ambulatory Visit: Payer: 59 | Admitting: Family

## 2020-02-26 ENCOUNTER — Ambulatory Visit: Payer: 59 | Admitting: Family

## 2020-03-15 ENCOUNTER — Other Ambulatory Visit: Payer: Self-pay | Admitting: Family

## 2020-03-15 DIAGNOSIS — G4700 Insomnia, unspecified: Secondary | ICD-10-CM

## 2020-03-15 DIAGNOSIS — F411 Generalized anxiety disorder: Secondary | ICD-10-CM

## 2020-04-21 ENCOUNTER — Other Ambulatory Visit: Payer: Self-pay | Admitting: Family

## 2020-04-21 DIAGNOSIS — F411 Generalized anxiety disorder: Secondary | ICD-10-CM

## 2020-06-07 ENCOUNTER — Other Ambulatory Visit: Payer: Self-pay | Admitting: Family

## 2020-06-07 DIAGNOSIS — G4700 Insomnia, unspecified: Secondary | ICD-10-CM

## 2020-06-07 DIAGNOSIS — F411 Generalized anxiety disorder: Secondary | ICD-10-CM

## 2020-06-22 ENCOUNTER — Other Ambulatory Visit: Payer: Self-pay | Admitting: Family

## 2020-06-22 DIAGNOSIS — F411 Generalized anxiety disorder: Secondary | ICD-10-CM

## 2020-08-23 ENCOUNTER — Other Ambulatory Visit: Payer: Self-pay | Admitting: Family

## 2020-08-23 DIAGNOSIS — F411 Generalized anxiety disorder: Secondary | ICD-10-CM

## 2020-09-18 ENCOUNTER — Encounter: Payer: Self-pay | Admitting: Family

## 2020-09-18 ENCOUNTER — Telehealth (INDEPENDENT_AMBULATORY_CARE_PROVIDER_SITE_OTHER): Payer: Self-pay | Admitting: Family

## 2020-09-18 VITALS — Ht 63.0 in | Wt 180.0 lb

## 2020-09-18 DIAGNOSIS — F411 Generalized anxiety disorder: Secondary | ICD-10-CM

## 2020-09-18 DIAGNOSIS — G4700 Insomnia, unspecified: Secondary | ICD-10-CM

## 2020-09-18 MED ORDER — TRAZODONE HCL 50 MG PO TABS: 50.0000 mg | ORAL_TABLET | Freq: Every day | ORAL | 1 refills | Status: DC

## 2020-09-18 NOTE — Assessment & Plan Note (Signed)
Worse of late. Suspect exacerbating anxiety. She is most bothered by poor sleep. Increase trazodone to 150mg  qhs. She will let me know how she is doing

## 2020-09-18 NOTE — Progress Notes (Signed)
Virtual Visit via Video Note  I connected with@  on 09/18/20 at 11:30 AM EDT by a video enabled telemedicine application and verified that I am speaking with the correct person using two identifiers.  Location patient: home Location provider:work  Persons participating in the virtual visit: patient, provider  I discussed the limitations of evaluation and management by telemedicine and the availability of in person appointments. The patient expressed understanding and agreed to proceed.   HPI: Follow up covid and trazodone Primary concern is trouble sleeping.   Had been in [place] 4 weeks ago, she and husband contracted COVID.  Diagnosed 08/16/20 with covid.  Continues to have fatigue, taste/smell  though 'improving everyday'. Cough is improving, some sputum. No fever, cp, sob.  Taking mucinex at night with relief.   Complains of trouble falling asleep for the last 4 weeks. Describes that she has trouble sleeping 'her whole life'. Thinks anxiety is contributory. doesn't feel trazodone working as it had been. Had  Been on trazodone 150mg . 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  Compliant with lexapro. Had been on zoloft, ambien in the past.  No depression. No si/hi.  Not fully vaccinated  ROS: See pertinent positives and negatives per HPI.    EXAM:  VITALS per patient if applicable:  GENERAL: alert, oriented, appears well and in no acute distress  HEENT: atraumatic, conjunttiva clear, no obvious abnormalities on inspection of external nose and ears  NECK: normal movements of the head and neck  LUNGS: on inspection no signs of respiratory distress, breathing rate appears normal, no obvious gross SOB, gasping or wheezing  CV: no obvious cyanosis  MS: moves all visible extremities without noticeable abnormality  PSYCH/NEURO: pleasant and cooperative, no obvious depression or anxiety, speech and thought processing grossly intact  ASSESSMENT AND PLAN:  Discussed the  following assessment and plan:  Problem List Items Addressed This Visit      Other   Generalized anxiety disorder    Worsened of late. Will maintain lexapro 10mg  for now however will likely increase after trazodone increase.       Relevant Medications   traZODone (DESYREL) 50 MG tablet   Insomnia - Primary    Worse of late. Suspect exacerbating anxiety. She is most bothered by poor sleep. Increase trazodone to 150mg  qhs. She will let me know how she is doing      Relevant Medications   traZODone (DESYREL) 50 MG tablet      -we discussed possible serious and likely etiologies, options for evaluation and workup, limitations of telemedicine visit vs in person visit, treatment, treatment risks and precautions. Pt prefers to treat via telemedicine empirically rather then risking or undertaking an in person visit at this moment.  .   I discussed the assessment and treatment plan with the patient. The patient was provided an opportunity to ask questions and all were answered. The patient agreed with the plan and demonstrated an understanding of the instructions.   The patient was advised to call back or seek an in-person evaluation if the symptoms worsen or if the condition fails to improve as anticipated.   Mable Paris, FNP

## 2020-09-18 NOTE — Assessment & Plan Note (Signed)
Worsened of late. Will maintain lexapro 10mg  for now however will likely increase after trazodone increase.

## 2020-09-18 NOTE — Patient Instructions (Signed)
Increase trazodone to 150mg 

## 2020-09-25 ENCOUNTER — Telehealth: Payer: Self-pay

## 2020-09-25 NOTE — Telephone Encounter (Signed)
lvm to schedule 3 month physical

## 2020-10-26 ENCOUNTER — Other Ambulatory Visit: Payer: Self-pay | Admitting: Family

## 2020-10-26 DIAGNOSIS — F411 Generalized anxiety disorder: Secondary | ICD-10-CM

## 2020-12-04 ENCOUNTER — Other Ambulatory Visit: Payer: Self-pay | Admitting: Family

## 2020-12-04 DIAGNOSIS — F411 Generalized anxiety disorder: Secondary | ICD-10-CM

## 2020-12-04 DIAGNOSIS — G4700 Insomnia, unspecified: Secondary | ICD-10-CM

## 2020-12-25 ENCOUNTER — Other Ambulatory Visit: Payer: Self-pay | Admitting: Family

## 2020-12-25 DIAGNOSIS — F411 Generalized anxiety disorder: Secondary | ICD-10-CM

## 2021-02-24 ENCOUNTER — Other Ambulatory Visit: Payer: Self-pay | Admitting: Family

## 2021-02-24 DIAGNOSIS — F411 Generalized anxiety disorder: Secondary | ICD-10-CM

## 2021-05-05 ENCOUNTER — Other Ambulatory Visit: Payer: Self-pay

## 2021-05-05 ENCOUNTER — Telehealth (INDEPENDENT_AMBULATORY_CARE_PROVIDER_SITE_OTHER): Payer: Self-pay | Admitting: Internal Medicine

## 2021-05-05 ENCOUNTER — Encounter: Payer: Self-pay | Admitting: Internal Medicine

## 2021-05-05 ENCOUNTER — Telehealth: Payer: Self-pay

## 2021-05-05 VITALS — Ht 63.0 in | Wt 190.0 lb

## 2021-05-05 DIAGNOSIS — R6889 Other general symptoms and signs: Secondary | ICD-10-CM

## 2021-05-05 DIAGNOSIS — J0110 Acute frontal sinusitis, unspecified: Secondary | ICD-10-CM

## 2021-05-05 DIAGNOSIS — H04203 Unspecified epiphora, bilateral lacrimal glands: Secondary | ICD-10-CM

## 2021-05-05 MED ORDER — AZITHROMYCIN 250 MG PO TABS: ORAL_TABLET | ORAL | 0 refills | Status: AC

## 2021-05-05 MED ORDER — SALINE SPRAY 0.65 % NA SOLN: 2.0000 | Freq: Every day | NASAL | 2 refills | Status: DC | PRN

## 2021-05-05 MED ORDER — OLOPATADINE HCL 0.2 % OP SOLN: 1.0000 [drp] | Freq: Every day | OPHTHALMIC | 0 refills | Status: DC | PRN

## 2021-05-05 NOTE — Progress Notes (Signed)
Virtual Visit via Video Note  I connected with Jodi Hawkins on 05/05/21 at 10:00 AM EDT by a video enabled telemedicine application and verified that I am speaking with the correct person using two identifiers.  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 The patient expressed understanding and agreed to proceed.   HPI: Last Thursday had nasal green discharge, left ear pain, itchy eye no known h/o allergies had covid 09/2020 and wearing a mask tried mucinex and generic antihistamine does not use flonase any longer as though was addicted No sick contacts No loss taste/smell/fever  C/o frontal sinusitis and pressure left cheeks and itchy eyes    -COVID-19 vaccine status: not  ROS: See pertinent positives and negatives per HPI.  Past Medical History:  Diagnosis Date  . Anxiety   . Migraines     No past surgical history on file.   Current Outpatient Medications:  .  azithromycin (ZITHROMAX) 250 MG tablet, With food Take 2 tablets on day 1, then 1 tablet daily on days 2 through 5, Disp: 6 tablet, Rfl: 0 .  escitalopram (LEXAPRO) 10 MG tablet, TAKE 1 TABLET(10 MG) BY MOUTH DAILY, Disp: 30 tablet, Rfl: 3 .  Olopatadine HCl 0.2 % SOLN, Apply 1 drop to eye daily as needed., Disp: 2.5 mL, Rfl: 0 .  sodium chloride (OCEAN) 0.65 % SOLN nasal spray, Place 2 sprays into both nostrils daily as needed for congestion., Disp: 30 mL, Rfl: 2 .  traZODone (DESYREL) 50 MG tablet, Take 1 tablet (50 mg total) by mouth at bedtime., Disp: 90 tablet, Rfl: 1 .  meloxicam (MOBIC) 15 MG tablet, Take 1 tablet (15 mg total) by mouth daily. (Patient not taking: Reported on 05/05/2021), Disp: 30 tablet, Rfl: 0 .  traZODone (DESYREL) 100 MG tablet, TAKE 1 TABLET BY MOUTH EVERY NIGHT AT BEDTIME (Patient not taking:  Reported on 05/05/2021), Disp: 90 tablet, Rfl: 1  EXAM:  VITALS per patient if applicable:  GENERAL: alert, oriented, appears well and in no acute distress  HEENT: atraumatic, conjunttiva clear, no obvious abnormalities on inspection of external nose and ears  NECK: normal movements of the head and neck  LUNGS: on inspection no signs of respiratory distress, breathing rate appears normal, no obvious gross SOB, gasping or wheezing  CV: no obvious cyanosis  MS: moves all visible extremities without noticeable abnormality  PSYCH/NEURO: pleasant and cooperative, no obvious depression or anxiety, speech and thought processing grossly intact  ASSESSMENT AND PLAN:  Discussed the following assessment and plan:  Acute frontal sinusitis, recurrence not specified - Plan: azithromycin (ZITHROMAX) 250 MG tablet, sodium chloride (OCEAN) 0.65 % SOLN nasal spray otc allergy pill prn  rec at home covid 19 test  Watery eyes Itchy eyes pataday eye drops   -we discussed possible serious and likely etiologies, options for evaluation and workup, limitations of telemedicine visit vs in person visit, treatment, treatment risks and precautions.   I discussed the assessment and treatment plan with the patient. The patient was provided an opportunity to ask questions and all were answered. The patient agreed with the plan and demonstrated an understanding of the instructions.    Time spent 20 min Delorise Jackson, MD

## 2021-05-05 NOTE — Telephone Encounter (Signed)
LVM to return to see PCP in 6 months

## 2021-05-05 NOTE — Progress Notes (Signed)
Onset of symptoms Thursday. Ear pain, headache, itchy eyes, nasal drainage with green color.  No one around the Patient is sick. Patient has slight history of allergies.

## 2021-06-27 ENCOUNTER — Other Ambulatory Visit: Payer: Self-pay | Admitting: Family

## 2021-06-27 DIAGNOSIS — F411 Generalized anxiety disorder: Secondary | ICD-10-CM

## 2021-09-27 ENCOUNTER — Other Ambulatory Visit: Payer: Self-pay | Admitting: Family

## 2021-09-27 DIAGNOSIS — F411 Generalized anxiety disorder: Secondary | ICD-10-CM

## 2021-09-27 DIAGNOSIS — G4700 Insomnia, unspecified: Secondary | ICD-10-CM

## 2021-10-21 ENCOUNTER — Other Ambulatory Visit: Payer: Self-pay | Admitting: Family

## 2021-10-21 DIAGNOSIS — F411 Generalized anxiety disorder: Secondary | ICD-10-CM

## 2022-01-21 ENCOUNTER — Other Ambulatory Visit: Payer: Self-pay

## 2022-01-21 ENCOUNTER — Ambulatory Visit (INDEPENDENT_AMBULATORY_CARE_PROVIDER_SITE_OTHER): Payer: 59 | Admitting: Family

## 2022-01-21 ENCOUNTER — Encounter: Payer: Self-pay | Admitting: Family

## 2022-01-21 ENCOUNTER — Ambulatory Visit (INDEPENDENT_AMBULATORY_CARE_PROVIDER_SITE_OTHER): Payer: 59

## 2022-01-21 VITALS — BP 118/76 | HR 100 | Temp 98.6°F | Resp 14 | Ht 63.0 in | Wt 194.2 lb

## 2022-01-21 DIAGNOSIS — M25561 Pain in right knee: Secondary | ICD-10-CM

## 2022-01-21 LAB — CBC WITH DIFFERENTIAL/PLATELET
Basophils Absolute: 0 10*3/uL (ref 0.0–0.1)
Basophils Relative: 0.7 % (ref 0.0–3.0)
Eosinophils Absolute: 0 10*3/uL (ref 0.0–0.7)
Eosinophils Relative: 0.1 % (ref 0.0–5.0)
HCT: 41.7 % (ref 36.0–46.0)
Hemoglobin: 14.1 g/dL (ref 12.0–15.0)
Lymphocytes Relative: 33.8 % (ref 12.0–46.0)
Lymphs Abs: 1.9 10*3/uL (ref 0.7–4.0)
MCHC: 33.9 g/dL (ref 30.0–36.0)
MCV: 87.4 fl (ref 78.0–100.0)
Monocytes Absolute: 0.4 10*3/uL (ref 0.1–1.0)
Monocytes Relative: 6.6 % (ref 3.0–12.0)
Neutro Abs: 3.3 10*3/uL (ref 1.4–7.7)
Neutrophils Relative %: 58.8 % (ref 43.0–77.0)
Platelets: 188 10*3/uL (ref 150.0–400.0)
RBC: 4.77 Mil/uL (ref 3.87–5.11)
RDW: 13.4 % (ref 11.5–15.5)
WBC: 5.7 10*3/uL (ref 4.0–10.5)

## 2022-01-21 LAB — COMPREHENSIVE METABOLIC PANEL
ALT: 13 U/L (ref 0–35)
AST: 14 U/L (ref 0–37)
Albumin: 4.1 g/dL (ref 3.5–5.2)
Alkaline Phosphatase: 68 U/L (ref 39–117)
BUN: 13 mg/dL (ref 6–23)
CO2: 28 mEq/L (ref 19–32)
Calcium: 9.8 mg/dL (ref 8.4–10.5)
Chloride: 104 mEq/L (ref 96–112)
Creatinine, Ser: 0.81 mg/dL (ref 0.40–1.20)
GFR: 83.27 mL/min (ref >60.00)
Glucose, Bld: 121 mg/dL — ABNORMAL HIGH (ref 70–99)
Potassium: 4 mEq/L (ref 3.5–5.1)
Sodium: 139 mEq/L (ref 135–145)
Total Bilirubin: 0.4 mg/dL (ref 0.2–1.2)
Total Protein: 6.8 g/dL (ref 6.0–8.3)

## 2022-01-21 LAB — SEDIMENTATION RATE: Sed Rate: 24 mm/hr (ref 0–30)

## 2022-01-21 LAB — C-REACTIVE PROTEIN: CRP: 1.1 mg/dL (ref 0.5–20.0)

## 2022-01-21 MED ORDER — PREDNISONE 10 MG PO TABS: ORAL_TABLET | ORAL | 0 refills | Status: DC

## 2022-01-21 MED ORDER — MELOXICAM 7.5 MG PO TABS: 7.5000 mg | ORAL_TABLET | Freq: Every day | ORAL | 1 refills | Status: DC | PRN

## 2022-01-21 NOTE — Progress Notes (Signed)
Subjective:    Patient ID: Jodi Hawkins, female    DOB: June 22, 1971, 51 y.o.   MRN: [id number]  CC: Jodi Hawkins is a 51 y.o. female who presents today for an acute visit.    HPI: Acute visit  Complains of right posterior knee pain, x one week, without improvement  Icing and wearing a knee brace without relief.  Describes as knee feels like it is floating and knee is swollen  She feels right knee is slightly warmer than left knee but hasnt noticed this today. No fever.   No redness, wound, recent surgery. Knee is not catching or feels it may give way Knee is worse after long periods of sitting.  Some relief of ibuprofen 600mg .   Low back pain had started first and thinks she slept wrong x one week ago . Since resolved but she questions if this was related.  No hip pain, numbness, trouble urinating.  No fall or injury.  No history of prosthetic joint.      Never smoker She is on OCP HISTORY:  Past Medical History:  Diagnosis Date   Anxiety    COVID-19    09/2020   Migraines    History reviewed. No pertinent surgical history. Family History  Problem Relation Age of Onset   Hypertension Mother    Hypertension Paternal Grandmother    Hypertension Paternal Grandfather    Breast cancer Neg Hx     Allergies: Patient has no known allergies. Current Outpatient Medications on File Prior to Visit  Medication Sig Dispense Refill   escitalopram (LEXAPRO) 10 MG tablet TAKE 1 TABLET(10 MG) BY MOUTH DAILY 30 tablet 3   Olopatadine HCl 0.2 % SOLN Apply 1 drop to eye daily as needed. 2.5 mL 0   sodium chloride (OCEAN) 0.65 % SOLN nasal spray Place 2 sprays into both nostrils daily as needed for congestion. 30 mL 2   traZODone (DESYREL) 100 MG tablet TAKE 1 TABLET BY MOUTH EVERY NIGHT AT BEDTIME 90 tablet 1   traZODone (DESYREL) 50 MG tablet Take 1 tablet (50 mg total) by mouth at bedtime. 90 tablet 1   meloxicam (MOBIC) 15 MG tablet Take 1 tablet (15 mg total) by mouth  daily. (Patient not taking: Reported on 05/05/2021) 30 tablet 0   No current facility-administered medications on file prior to visit.    Social History   Tobacco Use   Smoking status: Former    Types: Cigarettes    Quit date: 06/18/2012    Years since quitting: 9.6   Smokeless tobacco: Never  Substance Use Topics   Alcohol use: No    Alcohol/week: 0.0 standard drinks   Drug use: No    Review of Systems  Constitutional:  Negative for chills and fever.  Respiratory:  Negative for cough.   Cardiovascular:  Negative for chest pain and palpitations.  Gastrointestinal:  Negative for nausea and vomiting.  Musculoskeletal:  Positive for arthralgias and joint swelling. Negative for back pain (resolved).  Skin:  Negative for rash and wound.  Neurological:  Negative for numbness.     Objective:    BP 118/76 (BP Location: Left Arm, Patient Position: Sitting, Cuff Size: Large)    Pulse 100    Temp 98.6 F (37 C) (Oral)    Resp 14    Ht 5\' 3"  (1.6 m)    Wt 194 lb 3.2 oz (88.1 kg)    SpO2 95%    BMI 34.40 kg/m    Physical Exam Vitals  reviewed.  Constitutional:      Appearance: She is well-developed.  Eyes:     Conjunctiva/sclera: Conjunctivae normal.  Cardiovascular:     Rate and Rhythm: Normal rate and regular rhythm.     Pulses: Normal pulses.     Heart sounds: Normal heart sounds.  Pulmonary:     Effort: Pulmonary effort is normal.     Breath sounds: Normal breath sounds. No wheezing, rhonchi or rales.  Musculoskeletal:     Right knee: Normal. No swelling, erythema, ecchymosis, bony tenderness or crepitus. Normal range of motion. No tenderness.     Left knee: Normal. No swelling, erythema, ecchymosis, bony tenderness or crepitus. Normal range of motion. No tenderness.     Comments: Bilateral knees are symmetric. No effusion appreciated. No increase in warmth or erythema.  Right knee:  Able to extend to -5 to 10 degrees and flex to 110 degrees. No catching with McMurray  maneuver. No patellar apprehension. Negative anterior drawer and lachman's- no laxity appreciated.  No calf tenderness of lower leg edema bilaterally.    Skin:    General: Skin is warm and dry.  Neurological:     Mental Status: She is alert.  Psychiatric:        Speech: Speech normal.        Behavior: Behavior normal.        Thought Content: Thought content normal.       Assessment & Plan:   Problem List Items Addressed This Visit       Other   Right knee pain - Primary    Symptoms consistent with patellofemoral syndrome.  Suspect underlying osteoarthritis.  discussed conservative therapy including aggressive icing regimen, meloxicam prn and  short course of prednisone.  Counseled on side effects of irritability, insomnia on prednisone.  No increased heat, erythema to otherwise suggest infection or gout.  Pending x-ray, labs to further evaluation.  She will let me know how she is doing      Relevant Medications   predniSONE (DELTASONE) 10 MG tablet   meloxicam (MOBIC) 7.5 MG tablet   Other Relevant Orders   DG Knee Complete 4 Views Right (Completed)   C-reactive protein (Completed)   Sedimentation rate (Completed)   CBC with Differential/Platelet (Completed)   Comprehensive metabolic panel (Completed)      I am having Jodi Hawkins "Jodi Hawkins" start on predniSONE and meloxicam. I am also having her maintain her meloxicam, traZODone, sodium chloride, Olopatadine HCl, traZODone, and escitalopram.   Meds ordered this encounter  Medications   predniSONE (DELTASONE) 10 MG tablet    Sig: Take 40 mg by mouth on day 1, then taper 10 mg daily until gone    Dispense:  10 tablet    Refill:  0    Order Specific Question:   Supervising Provider    Answer:   Crecencio Mc [[id number]]   meloxicam (MOBIC) 7.5 MG tablet    Sig: Take 1 tablet (7.5 mg total) by mouth daily as needed for pain.    Dispense:  30 tablet    Refill:  1    Order Specific Question:   Supervising Provider     Answer:   Crecencio Mc [[id number]]    Return precautions given.   Risks, benefits, and alternatives of the medications and treatment plan prescribed today were discussed, and patient expressed understanding.   Education regarding symptom management and diagnosis given to patient on AVS.  Continue to follow with Burnard Hawthorne, FNP for routine  health maintenance.   Elton Sin and I agreed with plan.   Mable Paris, FNP

## 2022-01-21 NOTE — Patient Instructions (Signed)
Start prednisone as discussed today.  Please ensure you get all doses and prior to 2 PM as this medication can interfere with your sleep.  Increase icing regimen to 20 minutes, ideally 3 times per day  Meloxicam as needed as needed  Please let me know if no improvement  A couple of points in regards to meloxicam ( Mobic) -  This medication is not intended for daily , long term use. It is a potent anti inflammatory ( NSAID), and my intention is for you take as needed for moderate to severe pain. If you find yourself using daily, please let me know.   Please takes Mobic ( meloxicam) with FOOD since it is an anti-inflammatory as it can cause a GI bleed or ulcer. If you have a history of GI bleed or ulcer, please do NOT take.  Do no take over the counter aleve, motrin, advil, goody's powder for pain as they are also NSAIDs, and they are  in the same class as Mobic  Lastly, we will need to monitor kidney function while on Mobic, and if we were to see any decline in kidney function in the future, we would have to discontinue this medication.

## 2022-01-24 NOTE — Assessment & Plan Note (Addendum)
Symptoms consistent with patellofemoral syndrome.  Suspect underlying osteoarthritis.  discussed conservative therapy including aggressive icing regimen, meloxicam prn and  short course of prednisone.  Counseled on side effects of irritability, insomnia on prednisone.  No increased heat, erythema to otherwise suggest infection or gout.  Pending x-ray, labs to further evaluation.  She will let me know how she is doing

## 2022-04-19 ENCOUNTER — Other Ambulatory Visit: Payer: Self-pay | Admitting: Family

## 2022-04-19 DIAGNOSIS — F411 Generalized anxiety disorder: Secondary | ICD-10-CM

## 2022-08-10 ENCOUNTER — Encounter: Payer: Self-pay | Admitting: Internal Medicine

## 2022-08-10 ENCOUNTER — Telehealth (INDEPENDENT_AMBULATORY_CARE_PROVIDER_SITE_OTHER): Payer: BC Managed Care – PPO | Admitting: Internal Medicine

## 2022-08-10 VITALS — Ht 63.0 in | Wt 200.0 lb

## 2022-08-10 DIAGNOSIS — E785 Hyperlipidemia, unspecified: Secondary | ICD-10-CM | POA: Diagnosis not present

## 2022-08-10 DIAGNOSIS — Z13220 Encounter for screening for lipoid disorders: Secondary | ICD-10-CM

## 2022-08-10 DIAGNOSIS — M25561 Pain in right knee: Secondary | ICD-10-CM

## 2022-08-10 DIAGNOSIS — Z6835 Body mass index (BMI) 35.0-35.9, adult: Secondary | ICD-10-CM

## 2022-08-10 DIAGNOSIS — E538 Deficiency of other specified B group vitamins: Secondary | ICD-10-CM | POA: Diagnosis not present

## 2022-08-10 DIAGNOSIS — Z1329 Encounter for screening for other suspected endocrine disorder: Secondary | ICD-10-CM

## 2022-08-10 DIAGNOSIS — R7303 Prediabetes: Secondary | ICD-10-CM | POA: Insufficient documentation

## 2022-08-10 DIAGNOSIS — R32 Unspecified urinary incontinence: Secondary | ICD-10-CM

## 2022-08-10 DIAGNOSIS — M545 Low back pain, unspecified: Secondary | ICD-10-CM | POA: Insufficient documentation

## 2022-08-10 DIAGNOSIS — G8929 Other chronic pain: Secondary | ICD-10-CM | POA: Insufficient documentation

## 2022-08-10 DIAGNOSIS — Z6830 Body mass index (BMI) 30.0-30.9, adult: Secondary | ICD-10-CM | POA: Insufficient documentation

## 2022-08-10 DIAGNOSIS — R350 Frequency of micturition: Secondary | ICD-10-CM

## 2022-08-10 DIAGNOSIS — E559 Vitamin D deficiency, unspecified: Secondary | ICD-10-CM

## 2022-08-10 MED ORDER — WEGOVY 1 MG/0.5ML ~~LOC~~ SOAJ: 1.0000 mg | SUBCUTANEOUS | 0 refills | Status: DC

## 2022-08-10 MED ORDER — WEGOVY 1.7 MG/0.75ML ~~LOC~~ SOAJ: 1.7000 mg | SUBCUTANEOUS | 0 refills | Status: DC

## 2022-08-10 MED ORDER — WEGOVY 0.25 MG/0.5ML ~~LOC~~ SOAJ: 0.2500 mg | SUBCUTANEOUS | 0 refills | Status: DC

## 2022-08-10 MED ORDER — WEGOVY 0.5 MG/0.5ML ~~LOC~~ SOAJ: 0.5000 mg | SUBCUTANEOUS | 0 refills | Status: DC

## 2022-08-10 MED ORDER — WEGOVY 2.4 MG/0.75ML ~~LOC~~ SOAJ: 2.4000 mg | SUBCUTANEOUS | 11 refills | Status: DC

## 2022-08-10 NOTE — Progress Notes (Signed)
Virtual Visit via Video Note  I connected with Jodi Hawkins  on 08/10/22 at  3:30 PM EDT by a video enabled telemedicine application and verified that I am speaking with the correct person using two identifiers.  Location patient: [place] Location provider:work or home office Persons participating in the virtual visit: patient, provider  I discussed the limitations and requested verbal permission for telemedicine visit. The patient expressed understanding and agreed to proceed.   HPI:  Acute telemedicine visit for : Right knee chronic pain xray 01/2022 +small jt effusion and trace arthritis changes she is also having low back pain has knee brace and using otc topicals and nsaids w/o relief which limits her exercise and she is gaining weight  Obesity h/o prediabetes and hld pt wants to discuss wt loss meds had tried wt watchers and diet changes exercise limited  C/o urinary leakage at night does not cut fluids off at night drinks coffee in am and other than this water had has had 1 vaginal delivery and 1 adopted kid. Ongoing x 6 weeks. No discharge/odor, burning not established with GYN   -Pertinent past medical history: see below -Pertinent medication allergies:No Known Allergies -COVID-19 vaccine status:  Immunization History  Administered Date(s) Administered   Influenza Inj Mdck Quad Pf 10/09/2018   Influenza,inj,Quad PF,6+ Mos 11/01/2016, 09/12/2017   Influenza-Unspecified 09/18/2020     ROS: See pertinent positives and negatives per HPI.  Past Medical History:  Diagnosis Date   Anxiety    COVID-19    09/2020   Migraines     No past surgical history on file.   Current Outpatient Medications:    escitalopram (LEXAPRO) 10 MG tablet, TAKE 1 TABLET(10 MG) BY MOUTH DAILY, Disp: 30 tablet, Rfl: 3   meloxicam (MOBIC) 7.5 MG tablet, Take 1 tablet (7.5 mg total) by mouth daily as needed for pain., Disp: 30 tablet, Rfl: 1   Olopatadine HCl 0.2 % SOLN, Apply 1 drop to eye daily as needed.,  Disp: 2.5 mL, Rfl: 0   predniSONE (DELTASONE) 10 MG tablet, Take 40 mg by mouth on day 1, then taper 10 mg daily until gone, Disp: 10 tablet, Rfl: 0   Semaglutide-Weight Management (WEGOVY) 0.25 MG/0.5ML SOAJ, Inject 0.25 mg into the skin once a week. X 25month Disp: 2 mL, Rfl: 0   Semaglutide-Weight Management (WEGOVY) 0.5 MG/0.5ML SOAJ, Inject 0.5 mg into the skin once a week. X month 2, Disp: 2 mL, Rfl: 0   Semaglutide-Weight Management (WEGOVY) 1 MG/0.5ML SOAJ, Inject 1 mg into the skin once a week. X month 3, Disp: 2 mL, Rfl: 0   Semaglutide-Weight Management (WEGOVY) 1.7 MG/0.75ML SOAJ, Inject 1.7 mg into the skin once a week. X month 4, Disp: 3 mL, Rfl: 0   Semaglutide-Weight Management (WEGOVY) 2.4 MG/0.75ML SOAJ, Inject 2.4 mg into the skin once a week. X month 5 and beyond, Disp: 3 mL, Rfl: 11   sodium chloride (OCEAN) 0.65 % SOLN nasal spray, Place 2 sprays into both nostrils daily as needed for congestion., Disp: 30 mL, Rfl: 2   traZODone (DESYREL) 100 MG tablet, TAKE 1 TABLET BY MOUTH EVERY NIGHT AT BEDTIME, Disp: 90 tablet, Rfl: 1   traZODone (DESYREL) 50 MG tablet, Take 1 tablet (50 mg total) by mouth at bedtime., Disp: 90 tablet, Rfl: 1   meloxicam (MOBIC) 15 MG tablet, Take 1 tablet (15 mg total) by mouth daily. (Patient not taking: Reported on 05/05/2021), Disp: 30 tablet, Rfl: 0  EXAM:  VITALS per patient if  applicable:  GENERAL: alert, oriented, appears well and in no acute distress  HEENT: atraumatic, conjunttiva clear, no obvious abnormalities on inspection of external nose and ears  NECK: normal movements of the head and neck  LUNGS: on inspection no signs of respiratory distress, breathing rate appears normal, no obvious gross SOB, gasping or wheezing  CV: no obvious cyanosis  MS: moves all visible extremities without noticeable abnormality  PSYCH/NEURO: pleasant and cooperative, no obvious depression or anxiety, speech and thought processing grossly  intact  ASSESSMENT AND PLAN:  Discussed the following assessment and plan:  Urine frequency ?stress incontinence r/o Infection - Plan: Urinalysis, Routine w reflex microscopic, Urine Culture Consider GYN or urogyn consult in the future pt will let me know  Consider pelvic PT  BMI 35.0-35.9,adult - Plan: Semaglutide-Weight Management (WEGOVY) 0.25 MG/0.5ML SOAJ, Semaglutide-Weight Management (WEGOVY) 0.5 MG/0.5ML SOAJ, Semaglutide-Weight Management (WEGOVY) 1 MG/0.5ML SOAJ, Semaglutide-Weight Management (WEGOVY) 1.7 MG/0.75ML SOAJ, Semaglutide-Weight Management (WEGOVY) 2.4 MG/0.75ML SOAJ  Hyperlipidemia, unspecified hyperlipidemia type - Plan: Lipid panel, Semaglutide-Weight Management (WEGOVY) 0.25 MG/0.5ML SOAJ, Semaglutide-Weight Management (WEGOVY) 0.5 MG/0.5ML SOAJ, Semaglutide-Weight Management (WEGOVY) 1 MG/0.5ML SOAJ, Semaglutide-Weight Management (WEGOVY) 1.7 MG/0.75ML SOAJ, Semaglutide-Weight Management (WEGOVY) 2.4 MG/0.75ML SOAJ   Prediabetes - Plan: Hemoglobin A1c, Semaglutide-Weight Management (WEGOVY) 0.25 MG/0.5ML SOAJ, Semaglutide-Weight Management (WEGOVY) 0.5 MG/0.5ML SOAJ, Semaglutide-Weight Management (WEGOVY) 1 MG/0.5ML SOAJ, Semaglutide-Weight Management (WEGOVY) 1.7 MG/0.75ML SOAJ, Semaglutide-Weight Management (WEGOVY) 2.4 MG/0.75ML SOAJ  Chronic pain of right knee - Plan: Ambulatory referral to Orthopedic Surgery Chronic midline low back pain without sciatica - Plan: Ambulatory referral to Orthopedic Surgery She may need Xray of low back  Consider steroid infection of right knee   -we discussed possible serious and likely etiologies, options for evaluation and workup, limitations of telemedicine visit vs in person visit, treatment, treatment risks and precautions. Pt is agreeable to treatment via telemedicine at this moment.    I discussed the assessment and treatment plan with the patient. The patient was provided an opportunity to ask questions and all were  answered. The patient agreed with the plan and demonstrated an understanding of the instructions.    Time spent 20 minutes Delorise Jackson, MD

## 2022-08-10 NOTE — Patient Instructions (Addendum)
[facility] clinic ortho    Phone Fax E-mail Address  [phone] Not available [place] [address]     Specialties     Orthopedic Surgery      Wegovy 0.25 weekly x 1 month  0.5 weekly x 1 month  1 weekly x 1 month  1.7 weekly x 1 month  2.4 weekly x 1 month   Semaglutide Injection (Weight Management) What is this medication? SEMAGLUTIDE (SEM a GLOO tide) promotes weight loss. It may also be used to maintain weight loss. It works by decreasing appetite. Changes to diet and exercise are often combined with this medication. This medicine may be used for other purposes; ask your health care provider or pharmacist if you have questions. COMMON BRAND NAME(S): [id number] What should I tell my care team before I take this medication? They need to know if you have any of these conditions: Endocrine tumors (MEN 2) or if someone in your family had these tumors Eye disease, vision problems Gallbladder disease History of depression or mental health disease History of pancreatitis Kidney disease Stomach or intestine problems Suicidal thoughts, plans, or attempt; a previous suicide attempt by you or a family member Thyroid cancer or if someone in your family had thyroid cancer An unusual or allergic reaction to semaglutide, other medications, foods, dyes, or preservatives Pregnant or trying to get pregnant Breast-feeding How should I use this medication? This medication is injected under the skin. You will be taught how to prepare and give it. Take it as directed on the prescription label. It is given once every week (every 7 days). Keep taking it unless your care team tells you to stop. It is important that you put your used needles and pens in a special sharps container. Do not put them in a trash can. If you do not have a sharps container, call your pharmacist or care team to get one. A special MedGuide will be given to you by the  pharmacist with each prescription and refill. Be sure to read this information carefully each time. This medication comes with INSTRUCTIONS FOR USE. Ask your pharmacist for directions on how to use this medication. Read the information carefully. Talk to your pharmacist or care team if you have questions. Talk to your care team about the use of this medication in children. While it may be prescribed for children as Bower as 12 years for selected conditions, precautions do apply. Overdosage: If you think you have taken too much of this medicine contact a poison control center or emergency room at once. NOTE: This medicine is only for you. Do not share this medicine with others. What if I miss a dose? If you miss a dose and the next scheduled dose is more than 2 days away, take the missed dose as soon as possible. If you miss a dose and the next scheduled dose is less than 2 days away, do not take the missed dose. Take the next dose at your regular time. Do not take double or extra doses. If you miss your dose for 2 weeks or more, take the next dose at your regular time or call your care team to talk about how to restart this medication. What may interact with this medication? Insulin and other medications for diabetes This list may not describe all possible interactions. Give your health care provider a list of all the medicines, herbs, non-prescription drugs, or dietary supplements you use.  Also tell them if you smoke, drink alcohol, or use illegal drugs. Some items may interact with your medicine. What should I watch for while using this medication? Visit your care team for regular checks on your progress. It may be some time before you see the benefit from this medication. Drink plenty of fluids while taking this medication. Check with your care team if you have severe diarrhea, nausea, and vomiting, or if you sweat a lot. The loss of too much body fluid may make it dangerous for you to take this  medication. This medication may affect blood sugar levels. Ask your care team if changes in diet or medications are needed if you have diabetes. If you or your family notice any changes in your behavior, such as new or worsening depression, thoughts of harming yourself, anxiety, other unusual or disturbing thoughts, or memory loss, call your care team right away. Women should inform their care team if they wish to become pregnant or think they might be pregnant. Losing weight while pregnant is not advised and may cause harm to the unborn child. Talk to your care team for more information. What side effects may I notice from receiving this medication? Side effects that you should report to your care team as soon as possible: Allergic reactions--skin rash, itching, hives, swelling of the face, lips, tongue, or throat Change in vision Dehydration--increased thirst, dry mouth, feeling faint or lightheaded, headache, dark yellow or brown urine Gallbladder problems--severe stomach pain, nausea, vomiting, fever Heart palpitations--rapid, pounding, or irregular heartbeat Kidney injury--decrease in the amount of urine, swelling of the ankles, hands, or feet Pancreatitis--severe stomach pain that spreads to your back or gets worse after eating or when touched, fever, nausea, vomiting Thoughts of suicide or self-harm, worsening mood, feelings of depression Thyroid cancer--new mass or lump in the neck, pain or trouble swallowing, trouble breathing, hoarseness Side effects that usually do not require medical attention (report to your care team if they continue or are bothersome): Diarrhea Loss of appetite Nausea Stomach pain Vomiting This list may not describe all possible side effects. Call your doctor for medical advice about side effects. You may report side effects to FDA at 1-800-FDA-1088. Where should I keep my medication? Keep out of the reach of children and pets. Refrigeration (preferred): Store  in the refrigerator. Do not freeze. Keep this medication in the original container until you are ready to take it. Get rid of any unused medication after the expiration date. Room temperature: If needed, prior to cap removal, the pen can be stored at room temperature for up to 28 days. Protect from light. If it is stored at room temperature, get rid of any unused medication after 28 days or after it expires, whichever is first. It is important to get rid of the medication as soon as you no longer need it or it is expired. You can do this in two ways: Take the medication to a medication take-back program. Check with your pharmacy or law enforcement to find a location. If you cannot return the medication, follow the directions in the [place]. NOTE: This sheet is a summary. It may not cover all possible information. If you have questions about this medicine, talk to your doctor, pharmacist, or health care provider.  2023 Elsevier/Gold Standard (2021-02-18 00:00:00)  Kegel Exercises  Kegel exercises can help strengthen your pelvic floor muscles. The pelvic floor is a group of muscles that support your rectum, small intestine, and bladder. In females, pelvic floor muscles  also help support the uterus. These muscles help you control the flow of urine and stool (feces). Kegel exercises are painless and simple. They do not require any equipment. Your provider may suggest Kegel exercises to: Improve bladder and bowel control. Improve sexual response. Improve weak pelvic floor muscles after surgery to remove the uterus (hysterectomy) or after pregnancy, in females. Improve weak pelvic floor muscles after prostate gland removal or surgery, in males. Kegel exercises involve squeezing your pelvic floor muscles. These are the same muscles you squeeze when you try to stop the flow of urine or keep from passing gas. The exercises can be done while sitting, standing, or lying down, but it is best to vary your  position. Ask your health care provider which exercises are safe for you. Do exercises exactly as told by your health care provider and adjust them as directed. Do not begin these exercises until told by your health care provider. Exercises How to do Kegel exercises: Squeeze your pelvic floor muscles tight. You should feel a tight lift in your rectal area. If you are a female, you should also feel a tightness in your vaginal area. Keep your stomach, buttocks, and legs relaxed. Hold the muscles tight for up to 10 seconds. Breathe normally. Relax your muscles for up to 10 seconds. Repeat as told by your health care provider. Repeat this exercise daily as told by your health care provider. Continue to do this exercise for at least 4-6 weeks, or for as long as told by your health care provider. You may be referred to a physical therapist who can help you learn more about how to do Kegel exercises. Depending on your condition, your health care provider may recommend: Varying how long you squeeze your muscles. Doing several sets of exercises every day. Doing exercises for several weeks. Making Kegel exercises a part of your regular exercise routine. This information is not intended to replace advice given to you by your health care provider. Make sure you discuss any questions you have with your health care provider. Document Revised: 04/15/2021 Document Reviewed: 04/15/2021 Elsevier Patient Education  [place].  Urinary Incontinence Urinary incontinence refers to a condition in which a person is unable to control where and when to pass urine. A person with this condition will urinate involuntarily. This means that the person urinates when he or she does not mean to. What are the causes? This condition may be caused by: Medicines. Infections. Constipation. Overactive bladder muscles. Weak bladder muscles. Weak pelvic floor muscles. These muscles provide support for the bladder,  intestine, and, in women, the uterus. Enlarged prostate in men. The prostate is a gland near the bladder. When it gets too big, it can pinch the urethra. With the urethra blocked, the bladder can weaken and lose the ability to empty properly. Surgery. Emotional factors, such as anxiety, stress, or post-traumatic stress disorder (PTSD). Spinal cord injury, nerve injury, or other neurological conditions. Pelvic organ prolapse. This happens in women when organs move out of place and into the vagina. This movement can prevent the bladder and urethra from working properly. What increases the risk? The following factors may make you more likely to develop this condition: Age. The older you are, the higher the risk. Obesity. Being physically inactive. Pregnancy and childbirth. Menopause. Diseases that affect the nerves or spinal cord. Long-term, or chronic, coughing. This can increase pressure on the bladder and pelvic floor muscles. What are the signs or symptoms? Symptoms may vary depending on  the type of urinary incontinence you have. They include: A sudden urge to urinate, and passing urine involuntarily before you can get to a bathroom (urge incontinence). Suddenly passing urine when doing activities that force urine to pass, such as coughing, laughing, exercising, or sneezing (stress incontinence). Needing to urinate often but urinating only a small amount, or constantly dribbling urine (overflow incontinence). Urinating because you cannot get to the bathroom in time due to a physical disability, such as arthritis or injury, or due to a communication or thinking problem, such as Alzheimer's disease (functional incontinence). How is this diagnosed? This condition may be diagnosed based on: Your medical history. A physical exam. Tests, such as: Urine tests. X-rays of your kidney and bladder. Ultrasound. CT scan. Cystoscopy. In this procedure, a health care provider inserts a tube with a  light and camera (cystoscope) through the urethra and into the bladder to check for problems. Urodynamic testing. These tests assess how well the bladder, urethra, and sphincter can store and release urine. There are different types of urodynamic tests, and they vary depending on what the test is measuring. To help diagnose your condition, your health care provider may recommend that you keep a log of when you urinate and how much you urinate. How is this treated? Treatment for this condition depends on the type of incontinence that you have and its cause. Treatment may include: Lifestyle changes, such as: Quitting smoking. Maintaining a healthy weight. Staying active. Try to get 150 minutes of moderate-intensity exercise every week. Ask your health care provider which activities are safe for you. Eating a healthy diet. Avoid high-fat foods, like fried foods. Avoid refined carbohydrates like white bread and white rice. Limit how much alcohol and caffeine you drink. Increase your fiber intake. Healthy sources of fiber include beans, whole grains, and fresh fruits and vegetables. Behavioral changes, such as: Pelvic floor muscle exercises. Bladder training, such as lengthening the amount of time between bathroom breaks, or using the bathroom at regular intervals. Using techniques to suppress bladder urges. This can include distraction techniques or controlled breathing exercises. Medicines, such as: Medicines to relax the bladder muscles and prevent bladder spasms. Medicines to help slow or prevent the growth of a man's prostate. Botox injections. These can help relax the bladder muscles. Treatments, such as: Using pulses of electricity to help change bladder reflexes (electrical nerve stimulation). For women, using a medical device to prevent urine leaks. This is a small, tampon-like, disposable device that is inserted into the urethra. Injecting collagen or carbon beads (bulking agents) into  the urinary sphincter. These can help thicken tissue and close the bladder opening. Surgery. Follow these instructions at home: Lifestyle Limit alcohol and caffeine. These can fill your bladder quickly and irritate it. Keep yourself clean to help prevent odors and skin damage. Ask your health care provider about special skin creams and cleansers that can protect the skin from urine. Consider wearing pads or adult diapers. Make sure to change them regularly, and always change them right after experiencing incontinence. General instructions Take over-the-counter and prescription medicines only as told by your health care provider. Use the bathroom about every 3-4 hours, even if you do not feel the need to urinate. Try to empty your bladder completely every time. After urinating, wait a minute. Then try to urinate again. Make sure you are in a relaxed position while urinating. If your incontinence is caused by nerve problems, keep a log of the medicines you take and the times you  go to the bathroom. Keep all follow-up visits. This is important. Where to find more information [employer] of Diabetes and Digestive and Kidney Diseases: [employer] American Urology Association: www.urologyhealth.org Contact a health care provider if: You have pain that gets worse. Your incontinence gets worse. Get help right away if: You have a fever or chills. You are unable to urinate. You have redness in your groin area or down your legs. Summary Urinary incontinence refers to a condition in which a person is unable to control where and when to pass urine. This condition may be caused by medicines, infection, weak bladder muscles, weak pelvic floor muscles, enlargement of the prostate (in men), or surgery. Factors such as older age, obesity, pregnancy and childbirth, menopause, neurological diseases, and chronic coughing may increase your risk for developing this condition. Types of urinary  incontinence include urge incontinence, stress incontinence, overflow incontinence, and functional incontinence. This condition is usually treated first with lifestyle and behavioral changes, such as quitting smoking, eating a healthier diet, and doing regular pelvic floor exercises. Other treatment options include medicines, bulking agents, medical devices, electrical nerve stimulation, or surgery. This information is not intended to replace advice given to you by your health care provider. Make sure you discuss any questions you have with your health care provider. Document Revised: 07/10/2020 Document Reviewed: 07/10/2020 Elsevier Patient Education  [place].

## 2022-08-11 ENCOUNTER — Other Ambulatory Visit (INDEPENDENT_AMBULATORY_CARE_PROVIDER_SITE_OTHER): Payer: BC Managed Care – PPO

## 2022-08-11 ENCOUNTER — Encounter: Payer: Self-pay | Admitting: Internal Medicine

## 2022-08-11 DIAGNOSIS — E785 Hyperlipidemia, unspecified: Secondary | ICD-10-CM

## 2022-08-11 DIAGNOSIS — Z1329 Encounter for screening for other suspected endocrine disorder: Secondary | ICD-10-CM

## 2022-08-11 DIAGNOSIS — R7303 Prediabetes: Secondary | ICD-10-CM

## 2022-08-11 DIAGNOSIS — E559 Vitamin D deficiency, unspecified: Secondary | ICD-10-CM | POA: Diagnosis not present

## 2022-08-11 DIAGNOSIS — Z13220 Encounter for screening for lipoid disorders: Secondary | ICD-10-CM | POA: Diagnosis not present

## 2022-08-11 DIAGNOSIS — R350 Frequency of micturition: Secondary | ICD-10-CM

## 2022-08-11 DIAGNOSIS — E538 Deficiency of other specified B group vitamins: Secondary | ICD-10-CM | POA: Insufficient documentation

## 2022-08-11 LAB — HEMOGLOBIN A1C: Hgb A1c MFr Bld: 6 % (ref 4.6–6.5)

## 2022-08-11 LAB — TSH: TSH: 0.27 u[IU]/mL — ABNORMAL LOW (ref 0.35–5.50)

## 2022-08-11 LAB — LIPID PANEL
Cholesterol: 210 mg/dL — ABNORMAL HIGH (ref 0–200)
HDL: 34.5 mg/dL — ABNORMAL LOW (ref >39.00)
LDL Cholesterol: 136 mg/dL — ABNORMAL HIGH (ref 0–99)
NonHDL: 175.06
Total CHOL/HDL Ratio: 6
Triglycerides: 193 mg/dL — ABNORMAL HIGH (ref 0.0–149.0)
VLDL: 38.6 mg/dL (ref 0.0–40.0)

## 2022-08-11 LAB — VITAMIN B12: Vitamin B-12: 172 pg/mL — ABNORMAL LOW (ref 211–911)

## 2022-08-11 LAB — VITAMIN D 25 HYDROXY (VIT D DEFICIENCY, FRACTURES): VITD: 29.55 ng/mL — ABNORMAL LOW (ref 30.00–100.00)

## 2022-08-12 LAB — URINALYSIS, ROUTINE W REFLEX MICROSCOPIC
Bacteria, UA: NONE SEEN /HPF
Bilirubin Urine: NEGATIVE
Glucose, UA: NEGATIVE
Hyaline Cast: NONE SEEN /LPF
Ketones, ur: NEGATIVE
Nitrite: NEGATIVE
Protein, ur: NEGATIVE
RBC / HPF: NONE SEEN /HPF (ref 0–2)
Specific Gravity, Urine: 1.014 (ref 1.001–1.035)
pH: 5.5 (ref 5.0–8.0)

## 2022-08-12 LAB — URINE CULTURE
MICRO NUMBER:: 13826866
SPECIMEN QUALITY:: ADEQUATE

## 2022-08-15 ENCOUNTER — Telehealth: Payer: Self-pay

## 2022-08-15 NOTE — Telephone Encounter (Signed)
LMOM for pt to CB in regards to labs:   McLean-Scocuzza, Nino Glow, MD  Gracy Racer, CMA Vit D low rec taking D3 4000 to 5000 IU daily over the counter  Cholesterol improved but still elevated  -rec healthy diet and exercise if able  Mail cholesterol handout  B12 low  -rec B12 shots monthly in the clinic schedule  Thyroid lab is low  -sch f/u with [facility] in 6-8 weeks to repeat TSH, Free T3, free T4  +prediabetes  Urine culture no UTI

## 2022-08-16 ENCOUNTER — Other Ambulatory Visit: Payer: Self-pay | Admitting: Family

## 2022-08-16 ENCOUNTER — Ambulatory Visit: Payer: 59

## 2022-08-16 DIAGNOSIS — E538 Deficiency of other specified B group vitamins: Secondary | ICD-10-CM

## 2022-08-16 DIAGNOSIS — R7989 Other specified abnormal findings of blood chemistry: Secondary | ICD-10-CM

## 2022-08-16 NOTE — Progress Notes (Signed)
close

## 2022-08-17 ENCOUNTER — Ambulatory Visit (INDEPENDENT_AMBULATORY_CARE_PROVIDER_SITE_OTHER): Payer: BC Managed Care – PPO | Admitting: *Deleted

## 2022-08-17 DIAGNOSIS — E538 Deficiency of other specified B group vitamins: Secondary | ICD-10-CM

## 2022-08-17 MED ORDER — CYANOCOBALAMIN 1000 MCG/ML IJ SOLN
1000.0000 ug | Freq: Once | INTRAMUSCULAR | Status: AC
  Administered 2022-08-17: 1000 ug via INTRAMUSCULAR

## 2022-08-17 NOTE — Progress Notes (Signed)
Pt received B12 injection in left arm. Pt tolerated it well with no complaints

## 2022-08-28 ENCOUNTER — Other Ambulatory Visit: Payer: Self-pay | Admitting: Family

## 2022-08-28 DIAGNOSIS — F411 Generalized anxiety disorder: Secondary | ICD-10-CM

## 2022-09-19 ENCOUNTER — Ambulatory Visit: Payer: BC Managed Care – PPO

## 2022-09-22 ENCOUNTER — Ambulatory Visit (INDEPENDENT_AMBULATORY_CARE_PROVIDER_SITE_OTHER): Payer: BC Managed Care – PPO

## 2022-09-22 DIAGNOSIS — E538 Deficiency of other specified B group vitamins: Secondary | ICD-10-CM | POA: Diagnosis not present

## 2022-09-22 MED ORDER — CYANOCOBALAMIN 1000 MCG/ML IJ SOLN
1000.0000 ug | Freq: Once | INTRAMUSCULAR | Status: AC
  Administered 2022-09-22: 1000 ug via INTRAMUSCULAR

## 2022-09-22 NOTE — Progress Notes (Signed)
Patient presented for B 12 injection to left deltoid, patient voiced no concerns nor showed any signs of distress during injection. 

## 2022-10-03 ENCOUNTER — Encounter: Payer: Self-pay | Admitting: Family

## 2022-10-03 ENCOUNTER — Other Ambulatory Visit: Payer: Self-pay | Admitting: Family

## 2022-10-03 ENCOUNTER — Ambulatory Visit: Payer: BC Managed Care – PPO | Admitting: Family

## 2022-10-03 VITALS — BP 104/70 | HR 93 | Temp 98.6°F | Ht 63.0 in | Wt 197.4 lb

## 2022-10-03 DIAGNOSIS — Z6835 Body mass index (BMI) 35.0-35.9, adult: Secondary | ICD-10-CM

## 2022-10-03 DIAGNOSIS — R7989 Other specified abnormal findings of blood chemistry: Secondary | ICD-10-CM | POA: Diagnosis not present

## 2022-10-03 DIAGNOSIS — M25561 Pain in right knee: Secondary | ICD-10-CM

## 2022-10-03 DIAGNOSIS — Z1231 Encounter for screening mammogram for malignant neoplasm of breast: Secondary | ICD-10-CM

## 2022-10-03 DIAGNOSIS — R7303 Prediabetes: Secondary | ICD-10-CM

## 2022-10-03 DIAGNOSIS — E538 Deficiency of other specified B group vitamins: Secondary | ICD-10-CM | POA: Diagnosis not present

## 2022-10-03 DIAGNOSIS — Z1159 Encounter for screening for other viral diseases: Secondary | ICD-10-CM

## 2022-10-03 DIAGNOSIS — G8929 Other chronic pain: Secondary | ICD-10-CM

## 2022-10-03 DIAGNOSIS — G4700 Insomnia, unspecified: Secondary | ICD-10-CM | POA: Diagnosis not present

## 2022-10-03 LAB — B12 AND FOLATE PANEL
Folate: 12.3 ng/mL (ref >5.9)
Vitamin B-12: 310 pg/mL (ref 211–911)

## 2022-10-03 LAB — TSH: TSH: 0.37 u[IU]/mL (ref 0.35–5.50)

## 2022-10-03 LAB — T3, FREE: T3, Free: 3.3 pg/mL (ref 2.3–4.2)

## 2022-10-03 LAB — T4, FREE: Free T4: 0.63 ng/dL (ref 0.60–1.60)

## 2022-10-03 MED ORDER — METFORMIN HCL ER 500 MG PO TB24: 2000.0000 mg | ORAL_TABLET | Freq: Every evening | ORAL | 2 refills | Status: DC

## 2022-10-03 NOTE — Assessment & Plan Note (Signed)
Persistent.  Counseled on side effects of meloxicam and advised her to take with food.  Encouraged orthopedic consult.  She prefers to work on weight loss.  Knee rehab exercises provided

## 2022-10-03 NOTE — Assessment & Plan Note (Signed)
Lab Results  Component Value Date   HGBA1C 6.0 08/11/2022   Unfortunately Jodi Hawkins is on backorder.  She has no personal or family history of thyroid cancer, multiple endocrine neoplasia.  We have opted to start metformin and titrate.  Counseled on side effects.

## 2022-10-03 NOTE — Progress Notes (Signed)
Subjective:    Patient ID: Jodi Hawkins, female    DOB: 1971/03/23, 51 y.o.   MRN: [id number]  CC: Jodi Hawkins is a 51 y.o. female who presents today for follow up.   HPI: Feels well today.  No new complaints She continues to have right knee pain and she takes meloxicam 7.5 mg daily for this.  No history of peptic ulcer disease.   UXNATF initially prescribed by Dr Jodi Hawkins.  Unfortunately she been unable to get medication due to backorder.  History of preDM  No history of thyroid cancer, MEN  personally or in her family.  She stays up 3am , working and then sleeps until Jodi Hawkins trouble falling asleep, fatigue Sleep is restorative. Compliant with trazodone '50mg'$  without relief.  She feels stress over work and thinks anxiety may contribute.  Compliant with Lexapro 10 mg  No depression.   No si/hi  Ambien and melatonin cause her to be more awake.   Declines colonoscopy  B12 deficiency-compliant with B12 IM injection  HISTORY:  Past Medical History:  Diagnosis Date   Anxiety    COVID-19    09/2020   Migraines    History reviewed. No pertinent surgical history. Family History  Problem Relation Age of Onset   Hypertension Mother    Thyroid disease Sister    Hypertension Paternal Grandmother    Hypertension Paternal Grandfather    Breast cancer Neg Hx    Thyroid cancer Neg Hx     Allergies: Patient has no known allergies. Current Outpatient Medications on File Prior to Visit  Medication Sig Dispense Refill   escitalopram (LEXAPRO) 10 MG tablet TAKE 1 TABLET(10 MG) BY MOUTH DAILY 30 tablet 3   meloxicam (MOBIC) 15 MG tablet Take 1 tablet (15 mg total) by mouth daily. 30 tablet 0   meloxicam (MOBIC) 7.5 MG tablet Take 1 tablet (7.5 mg total) by mouth daily as needed for pain. 30 tablet 1   Olopatadine HCl 0.2 % SOLN Apply 1 drop to eye daily as needed. 2.5 mL 0   traZODone (DESYREL) 50 MG tablet Take 1 tablet (50 mg total) by mouth at bedtime. 90 tablet 1    sodium chloride (OCEAN) 0.65 % SOLN nasal spray Place 2 sprays into both nostrils daily as needed for congestion. (Patient not taking: Reported on 10/03/2022) 30 mL 2   No current facility-administered medications on file prior to visit.    Social History   Tobacco Use   Smoking status: Former    Types: Cigarettes    Quit date: 06/18/2012    Years since quitting: 10.2   Smokeless tobacco: Never  Substance Use Topics   Alcohol use: No    Alcohol/week: 0.0 standard drinks of alcohol   Drug use: No    Review of Systems  Constitutional:  Positive for fatigue. Negative for chills and fever.  Respiratory:  Negative for cough.   Cardiovascular:  Negative for chest pain and palpitations.  Gastrointestinal:  Negative for nausea and vomiting.  Musculoskeletal:  Positive for arthralgias (right knee).  Psychiatric/Behavioral:  Positive for sleep disturbance. Negative for suicidal ideas. The patient is nervous/anxious.       Objective:    BP 104/70 (BP Location: Left Arm, Patient Position: Sitting, Cuff Size: Large)   Pulse 93   Temp 98.6 F (37 C) (Oral)   Ht '5\' 3"'$  (1.6 m)   Wt 197 lb 6.4 oz (89.5 kg)   SpO2 95%   BMI 34.97 kg/m  BP Readings from Last 3 Encounters:  10/03/22 104/70  01/21/22 118/76  01/05/20 129/79   Wt Readings from Last 3 Encounters:  10/03/22 197 lb 6.4 oz (89.5 kg)  08/10/22 200 lb (90.7 kg)  01/21/22 194 lb 3.2 oz (88.1 kg)    Physical Exam Vitals reviewed.  Constitutional:      Appearance: She is well-developed.  Eyes:     Conjunctiva/sclera: Conjunctivae normal.  Neck:     Thyroid: No thyroid mass, thyromegaly or thyroid tenderness.  Cardiovascular:     Rate and Rhythm: Normal rate and regular rhythm.     Pulses: Normal pulses.     Heart sounds: Normal heart sounds.  Pulmonary:     Effort: Pulmonary effort is normal.     Breath sounds: Normal breath sounds. No wheezing, rhonchi or rales.  Skin:    General: Skin is warm and dry.   Neurological:     Mental Status: She is alert.  Psychiatric:        Speech: Speech normal.        Behavior: Behavior normal.        Thought Content: Thought content normal.        Assessment & Plan:   Problem List Items Addressed This Visit       Other   B12 deficiency    Currently receiving IM B12 injections from our office.  We will continue.  Pending B12 studies at this time.       BMI 35.0-35.9,adult   Relevant Medications   metFORMIN (GLUCOPHAGE-XR) 500 MG 24 hr tablet   Insomnia   Prediabetes    Lab Results  Component Value Date   HGBA1C 6.0 08/11/2022  Unfortunately Jodi Hawkins is on backorder.  She has no personal or family history of thyroid cancer, multiple endocrine neoplasia.  We have opted to start metformin and titrate.  Counseled on side effects.      Right knee pain    Persistent.  Counseled on side effects of meloxicam and advised her to take with food.  Encouraged orthopedic consult.  She prefers to work on weight loss.  Knee rehab exercises provided      Other Visit Diagnoses     Encounter for hepatitis C screening test for low risk patient    -  Primary   Relevant Orders   Hepatitis C antibody   Encounter for screening mammogram for malignant neoplasm of breast       Relevant Orders   MM 3D SCREEN BREAST BILATERAL   Low TSH level            I have discontinued Jodi Dockham "Kris"'s predniSONE, Jodi Hawkins, Wegovy, Wegovy, and Wegovy. I am also having her start on metFORMIN. Additionally, I am having her maintain her meloxicam, traZODone, sodium chloride, Olopatadine HCl, meloxicam, and escitalopram.   Meds ordered this encounter  Medications   metFORMIN (GLUCOPHAGE-XR) 500 MG 24 hr tablet    Sig: Take 4 tablets (2,000 mg total) by mouth every evening.    Dispense:  90 tablet    Refill:  2    Order Specific Question:   Supervising Provider    Answer:   Jodi Hawkins [[id number]]    Return precautions given.   Risks, benefits, and  alternatives of the medications and treatment plan prescribed today were discussed, and patient expressed understanding.   Education regarding symptom management and diagnosis given to patient on AVS.  Continue to follow with Jodi Hawthorne, FNP for routine health maintenance.  Jodi Hawkins and I agreed with plan.   Mable Paris, FNP

## 2022-10-03 NOTE — Patient Instructions (Addendum)
   Please make sure that you titrate per below so not to cause any GI upset.  The instructions on the metformin bottle however say metformin 1000 mg twice daily so that you do not run out of medication when you are on maximum dose but you will need to titrate to get there.    Lets trial metformin  Start metformin XR with one '500mg'$  tablet at night. After one week, you may increase to two tablets at night ( total of '1000mg'$ ) . The third week, you may take take two tablets at night and one tablet in the morning.  The fourth week, you may take two tablets in the morning ( '1000mg'$  total) and two tablets at night ('1000mg'$  total). This will bring you to a maximum daily dose of '2000mg'$ /day which is maximum dose. Along the way, if you want to increase more slowly, please do as this medication can cause GI discomfort and loose stools which usually get better with time , however some patients find that they can only tolerate a certain dose and cannot increase to maximum dose.    Please call  and schedule your 3D mammogram and /or bone density scan as we discussed.   [facility]  ( new location in 2023)  [address], [place], [place] [address]  [place], [place]

## 2022-10-03 NOTE — Assessment & Plan Note (Signed)
Currently receiving IM B12 injections from our office.  We will continue.  Pending B12 studies at this time.

## 2022-10-04 ENCOUNTER — Other Ambulatory Visit: Payer: Self-pay | Admitting: Family

## 2022-10-04 ENCOUNTER — Encounter: Payer: Self-pay | Admitting: Family

## 2022-10-04 DIAGNOSIS — G4700 Insomnia, unspecified: Secondary | ICD-10-CM

## 2022-10-04 DIAGNOSIS — F411 Generalized anxiety disorder: Secondary | ICD-10-CM

## 2022-10-04 DIAGNOSIS — Z6835 Body mass index (BMI) 35.0-35.9, adult: Secondary | ICD-10-CM

## 2022-10-04 DIAGNOSIS — R7303 Prediabetes: Secondary | ICD-10-CM

## 2022-10-04 LAB — CELIAC DISEASE AB SCREEN W/RFX
Antigliadin Abs, IgA: 2 units (ref 0–19)
IgA/Immunoglobulin A, Serum: 216 mg/dL (ref 87–352)
Transglutaminase IgA: <2 U/mL (ref 0–3)

## 2022-10-04 LAB — THYROTROPIN RECEPTOR AUTOABS: Thyrotropin Receptor Ab: <1.1 IU/L (ref 0.00–1.75)

## 2022-10-04 MED ORDER — METFORMIN HCL 500 MG PO TABS: 1000.0000 mg | ORAL_TABLET | Freq: Two times a day (BID) | ORAL | 3 refills | Status: DC

## 2022-10-05 LAB — INTRINSIC FACTOR ANTIBODIES: Intrinsic Factor: NEGATIVE

## 2022-10-05 LAB — HOMOCYSTEINE: Homocysteine: 10.5 umol/L — ABNORMAL HIGH (ref <10.4)

## 2022-10-05 LAB — METHYLMALONIC ACID, SERUM: Methylmalonic Acid, Quant: 143 nmol/L (ref 87–318)

## 2022-10-05 LAB — HEPATITIS C ANTIBODY: Hepatitis C Ab: NONREACTIVE

## 2022-10-06 ENCOUNTER — Other Ambulatory Visit: Payer: Self-pay

## 2022-10-25 ENCOUNTER — Ambulatory Visit (INDEPENDENT_AMBULATORY_CARE_PROVIDER_SITE_OTHER): Payer: BC Managed Care – PPO

## 2022-10-25 DIAGNOSIS — E538 Deficiency of other specified B group vitamins: Secondary | ICD-10-CM

## 2022-10-25 MED ORDER — CYANOCOBALAMIN 1000 MCG/ML IJ SOLN
1000.0000 ug | Freq: Once | INTRAMUSCULAR | Status: AC
  Administered 2022-10-25: 1000 ug via INTRAMUSCULAR

## 2022-10-25 NOTE — Progress Notes (Signed)
Patient presented for B 12 injection to left deltoid, patient voiced no concerns nor showed any signs of distress during injection. 

## 2022-11-24 ENCOUNTER — Ambulatory Visit: Payer: BC Managed Care – PPO

## 2022-12-07 ENCOUNTER — Ambulatory Visit (INDEPENDENT_AMBULATORY_CARE_PROVIDER_SITE_OTHER): Payer: BC Managed Care – PPO

## 2022-12-07 DIAGNOSIS — R059 Cough, unspecified: Secondary | ICD-10-CM | POA: Diagnosis not present

## 2022-12-07 DIAGNOSIS — E538 Deficiency of other specified B group vitamins: Secondary | ICD-10-CM | POA: Diagnosis not present

## 2022-12-07 DIAGNOSIS — J101 Influenza due to other identified influenza virus with other respiratory manifestations: Secondary | ICD-10-CM | POA: Diagnosis not present

## 2022-12-07 MED ORDER — CYANOCOBALAMIN 1000 MCG/ML IJ SOLN
1000.0000 ug | Freq: Once | INTRAMUSCULAR | Status: AC
  Administered 2022-12-07: 1000 ug via INTRAMUSCULAR

## 2022-12-07 NOTE — Progress Notes (Signed)
Pt presented for their vitamin B12 injection. Pt was identified through two identifiers. Pt tolerated shot well in their left  deltoid.  

## 2022-12-26 ENCOUNTER — Telehealth: Payer: Self-pay | Admitting: Family

## 2022-12-26 DIAGNOSIS — F411 Generalized anxiety disorder: Secondary | ICD-10-CM

## 2022-12-28 ENCOUNTER — Encounter: Payer: Self-pay | Admitting: Family

## 2022-12-30 NOTE — Telephone Encounter (Signed)
LVM to call back to schedule appt

## 2023-01-02 NOTE — Telephone Encounter (Signed)
Rx sent in to pharmacy. 

## 2023-01-02 NOTE — Telephone Encounter (Signed)
Patient called about her escitalopram (LEXAPRO) 10 MG tablet and traZODone (DESYREL) 100 MG tablet being refilled. She does have an appointment scheduled on 01/11/23.

## 2023-01-03 NOTE — Telephone Encounter (Signed)
Pt has appt in office on 01/11/23 to discuss back issues and get b12

## 2023-01-09 ENCOUNTER — Ambulatory Visit: Payer: BC Managed Care – PPO

## 2023-01-11 ENCOUNTER — Ambulatory Visit (INDEPENDENT_AMBULATORY_CARE_PROVIDER_SITE_OTHER): Payer: BC Managed Care – PPO

## 2023-01-11 ENCOUNTER — Ambulatory Visit: Payer: BC Managed Care – PPO | Admitting: Family

## 2023-01-11 ENCOUNTER — Encounter: Payer: Self-pay | Admitting: Family

## 2023-01-11 VITALS — BP 110/78 | HR 85 | Temp 98.3°F | Ht 62.0 in | Wt 192.8 lb

## 2023-01-11 DIAGNOSIS — M5137 Other intervertebral disc degeneration, lumbosacral region: Secondary | ICD-10-CM | POA: Diagnosis not present

## 2023-01-11 DIAGNOSIS — R5383 Other fatigue: Secondary | ICD-10-CM | POA: Diagnosis not present

## 2023-01-11 DIAGNOSIS — F411 Generalized anxiety disorder: Secondary | ICD-10-CM

## 2023-01-11 DIAGNOSIS — G8929 Other chronic pain: Secondary | ICD-10-CM | POA: Diagnosis not present

## 2023-01-11 DIAGNOSIS — M549 Dorsalgia, unspecified: Secondary | ICD-10-CM | POA: Diagnosis not present

## 2023-01-11 DIAGNOSIS — M25561 Pain in right knee: Secondary | ICD-10-CM | POA: Diagnosis not present

## 2023-01-11 DIAGNOSIS — E538 Deficiency of other specified B group vitamins: Secondary | ICD-10-CM

## 2023-01-11 DIAGNOSIS — R7309 Other abnormal glucose: Secondary | ICD-10-CM | POA: Diagnosis not present

## 2023-01-11 DIAGNOSIS — M545 Low back pain, unspecified: Secondary | ICD-10-CM

## 2023-01-11 DIAGNOSIS — R7303 Prediabetes: Secondary | ICD-10-CM

## 2023-01-11 DIAGNOSIS — Z1211 Encounter for screening for malignant neoplasm of colon: Secondary | ICD-10-CM

## 2023-01-11 DIAGNOSIS — Z122 Encounter for screening for malignant neoplasm of respiratory organs: Secondary | ICD-10-CM

## 2023-01-11 LAB — POCT GLYCOSYLATED HEMOGLOBIN (HGB A1C): Hemoglobin A1C: 5.4 % (ref 4.0–5.6)

## 2023-01-11 MED ORDER — MELOXICAM 7.5 MG PO TABS: ORAL_TABLET | ORAL | 1 refills | Status: DC

## 2023-01-11 MED ORDER — CYANOCOBALAMIN 1000 MCG/ML IJ SOLN
1000.0000 ug | Freq: Once | INTRAMUSCULAR | Status: AC
  Administered 2023-01-11: 1000 ug via INTRAMUSCULAR

## 2023-01-11 MED ORDER — DULOXETINE HCL 30 MG PO CPEP: 30.0000 mg | ORAL_CAPSULE | Freq: Every day | ORAL | 3 refills | Status: DC

## 2023-01-11 NOTE — Patient Instructions (Addendum)
Stop Lexapro, start cymbalta.   May use meloxicam 7.5 mg intermittently.  Do not take with ibuprofen is in the same drug class   Please call  and schedule your 3D mammogram and /or bone density scan as we discussed.   [facility]  ( new location in 2023)  [address], [place], [place] [address]  [place], [place] Referral to physical therapy, colonoscopy and to pulmonology for evaluation of sleep apnea. Let us know if you dont hear back within a week in regards to an appointment being scheduled.   So that you are aware, if you are [facility] MyChart user , please pay attention to your MyChart messages as you may receive a MyChart message with a phone number to call and schedule this test/appointment own your own from our referral coordinator. This is a new process so I do not want you to miss this message.  If you are not a MyChart user, you will receive a phone call.    you are due for annual lung cancer screening program.    Previously the program had been managed under [facility] hematology/oncology, now it has been moved to [facility] pulmonology .    I have placed a referral to [facility] pulmonology  and their office will reach out to you to schedule your CT of your chest.  They will reach out to you annually going forward.  If you do not hear from their office in the next 1 to 2 weeks, please call [facility] at [phone]   So let me know if there are any issues in getting scheduled.   Low Back Sprain or Strain Rehab Ask your health care provider which exercises are safe for you. Do exercises exactly as told by your health care provider and adjust them as directed. It is normal to feel mild stretching, pulling, tightness, or discomfort as you do these exercises. Stop right away if you feel sudden pain or your pain gets worse. Do not begin these exercises until told by your health care provider. Stretching and range-of-motion  exercises These exercises warm up your muscles and joints and improve the movement and flexibility of your back. These exercises also help to relieve pain, numbness, and tingling. Lumbar rotation  Lie on your back on a firm bed or the floor with your knees bent. Straighten your arms out to your sides so each arm forms a 90-degree angle (right angle) with a side of your body. Slowly move (rotate) both of your knees to one side of your body until you feel a stretch in your lower back (lumbar). Try not to let your shoulders lift off the floor. Hold this position for __________ seconds. Tense your abdominal muscles and slowly move your knees back to the starting position. Repeat this exercise on the other side of your body. Repeat __________ times. Complete this exercise __________ times a day. Single knee to chest  Lie on your back on a firm bed or the floor with both legs straight. Bend one of your knees. Use your hands to move your knee up toward your chest until you feel a gentle stretch in your lower back and buttock. Hold your leg in this position by holding on to the front of your knee. Keep your other leg as straight as possible. Hold this position for __________ seconds. Slowly return to the starting position. Repeat with your other leg. Repeat __________ times. Complete this exercise __________ times  a day. Prone extension on elbows  Lie on your abdomen on a firm bed or the floor (prone position). Prop yourself up on your elbows. Use your arms to help lift your chest up until you feel a gentle stretch in your abdomen and your lower back. This will place some of your body weight on your elbows. If this is uncomfortable, try stacking pillows under your chest. Your hips should stay down, against the surface that you are lying on. Keep your hip and back muscles relaxed. Hold this position for __________ seconds. Slowly relax your upper body and return to the starting position. Repeat  __________ times. Complete this exercise __________ times a day. Strengthening exercises These exercises build strength and endurance in your back. Endurance is the ability to use your muscles for a long time, even after they get tired. Pelvic tilt This exercise strengthens the muscles that lie deep in the abdomen. Lie on your back on a firm bed or the floor with your legs extended. Bend your knees so they are pointing toward the ceiling and your feet are flat on the floor. Tighten your lower abdominal muscles to press your lower back against the floor. This motion will tilt your pelvis so your tailbone points up toward the ceiling instead of pointing to your feet or the floor. To help with this exercise, you may place a small towel under your lower back and try to push your back into the towel. Hold this position for __________ seconds. Let your muscles relax completely before you repeat this exercise. Repeat __________ times. Complete this exercise __________ times a day. Alternating arm and leg raises  Get on your hands and knees on a firm surface. If you are on a hard floor, you may want to use padding, such as an exercise mat, to cushion your knees. Line up your arms and legs. Your hands should be directly below your shoulders, and your knees should be directly below your hips. Lift your left leg behind you. At the same time, raise your right arm and straighten it in front of you. Do not lift your leg higher than your hip. Do not lift your arm higher than your shoulder. Keep your abdominal and back muscles tight. Keep your hips facing the ground. Do not arch your back. Keep your balance carefully, and do not hold your breath. Hold this position for __________ seconds. Slowly return to the starting position. Repeat with your right leg and your left arm. Repeat __________ times. Complete this exercise __________ times a day. Abdominal set with straight leg raise  Lie on your back on a  firm bed or the floor. Bend one of your knees and keep your other leg straight. Tense your abdominal muscles and lift your straight leg up, 4-6 inches (10-15 cm) off the ground. Keep your abdominal muscles tight and hold this position for __________ seconds. Do not hold your breath. Do not arch your back. Keep it flat against the ground. Keep your abdominal muscles tense as you slowly lower your leg back to the starting position. Repeat with your other leg. Repeat __________ times. Complete this exercise __________ times a day. Single leg lower with bent knees Lie on your back on a firm bed or the floor. Tense your abdominal muscles and lift your feet off the floor, one foot at a time, so your knees and hips are bent in 90-degree angles (right angles). Your knees should be over your hips and your lower legs should be  parallel to the floor. Keeping your abdominal muscles tense and your knee bent, slowly lower one of your legs so your toe touches the ground. Lift your leg back up to return to the starting position. Do not hold your breath. Do not let your back arch. Keep your back flat against the ground. Repeat with your other leg. Repeat __________ times. Complete this exercise __________ times a day. Posture and body mechanics Good posture and healthy body mechanics can help to relieve stress in your body's tissues and joints. Body mechanics refers to the movements and positions of your body while you do your daily activities. Posture is part of body mechanics. Good posture means: Your spine is in its natural S-curve position (neutral). Your shoulders are pulled back slightly. Your head is not tipped forward (neutral). Follow these guidelines to improve your posture and body mechanics in your everyday activities. Standing  When standing, keep your spine neutral and your feet about hip-width apart. Keep a slight bend in your knees. Your ears, shoulders, and hips should line up. When you  do a task in which you stand in one place for a long time, place one foot up on a stable object that is 2-4 inches (5-10 cm) high, such as a footstool. This helps keep your spine neutral. Sitting  When sitting, keep your spine neutral and keep your feet flat on the floor. Use a footrest, if necessary, and keep your thighs parallel to the floor. Avoid rounding your shoulders, and avoid tilting your head forward. When working at a desk or a computer, keep your desk at a height where your hands are slightly lower than your elbows. Slide your chair under your desk so you are close enough to maintain good posture. When working at a computer, place your monitor at a height where you are looking straight ahead and you do not have to tilt your head forward or downward to look at the screen. Resting When lying down and resting, avoid positions that are most painful for you. If you have pain with activities such as sitting, bending, stooping, or squatting, lie in a position in which your body does not bend very much. For example, avoid curling up on your side with your arms and knees near your chest (fetal position). If you have pain with activities such as standing for a long time or reaching with your arms, lie with your spine in a neutral position and bend your knees slightly. Try the following positions: Lying on your side with a pillow between your knees. Lying on your back with a pillow under your knees. Lifting  When lifting objects, keep your feet at least shoulder-width apart and tighten your abdominal muscles. Bend your knees and hips and keep your spine neutral. It is important to lift using the strength of your legs, not your back. Do not lock your knees straight out. Always ask for help to lift heavy or awkward objects. This information is not intended to replace advice given to you by your health care provider. Make sure you discuss any questions you have with your health care  provider. Document Revised: 02/22/2021 Document Reviewed: 02/22/2021 Elsevier Patient Education  [place].

## 2023-01-11 NOTE — Assessment & Plan Note (Signed)
Lab Results  Component Value Date   HGBA1C 5.4 01/11/2023   Significant improvement in A1c.  Patient has also had weight loss.  Continue metformin 2000 mg daily

## 2023-01-11 NOTE — Assessment & Plan Note (Signed)
She is  deriving therapeutic benefit from B12 with improved energy, although temporary.  Will continue B12 injections monthly for now.

## 2023-01-11 NOTE — Progress Notes (Addendum)
Assessment & Plan:  Other fatigue Assessment & Plan: Chronic, persistent.  Concern for underlying sleep apnea as sleep is not restorative.  Suspect fatigue may also be multifactorial as she is limited in regards to exercise due to low back pain.  Long discussion as it relates to fatigue and the importance of ensuring preventative maintenance is up-to-date.  She will schedule her mammogram.  Colonoscopy referral has been placed as well as CT lung cancer screen.  She will return for Pap  Orders: -     Ambulatory referral to Pulmonology  Screening for colon cancer -     Ambulatory referral to Gastroenterology  Elevated glucose -     POCT glycosylated hemoglobin (Hb A1C)  Acute pain of right knee -     Meloxicam; TAKE 1 TABLET(7.5 MG) BY MOUTH DAILY AS NEEDED FOR PAIN  Dispense: 30 tablet; Refill: 1  B12 deficiency Assessment & Plan: She is  deriving therapeutic benefit from B12 with improved energy, although temporary.  Will continue B12 injections monthly for now.  Orders: -     CBC with Differential/Platelet -     Iron, TIBC and Ferritin Panel -     Cyanocobalamin  Generalized anxiety disorder -     DULoxetine HCl; Take 1 capsule (30 mg total) by mouth daily.  Dispense: 30 capsule; Refill: 3  Chronic midline low back pain without sciatica Assessment & Plan: Acute on chronic.  Pending thoracic, lumbar x-ray.  Trial of Cymbalta 30 mg.  Patient will stop Lexapro.  Advised exercise caution in regards to NSAID use.  I refilled meloxicam for her to use as needed.  Encouraged patient, Salonpas pain patch.  She will start physical therapy  Orders: -     Ambulatory referral to Physical Therapy -     DG Lumbar Spine Complete; Future -     DG Thoracic Spine W/Swimmers; Future -     Meloxicam; TAKE 1 TABLET(7.5 MG) BY MOUTH DAILY AS NEEDED FOR PAIN  Dispense: 30 tablet; Refill: 1 -     DULoxetine HCl; Take 1 capsule (30 mg total) by mouth daily.  Dispense: 30 capsule; Refill: 3  Screen  for colon cancer  Screening for lung cancer -     Ambulatory Referral for Lung Cancer Scre  Prediabetes Assessment & Plan: Lab Results  Component Value Date   HGBA1C 5.4 01/11/2023   Significant improvement in A1c.  Patient has also had weight loss.  Continue metformin 2000 mg daily      Return precautions given.   Risks, benefits, and alternatives of the medications and treatment plan prescribed today were discussed, and patient expressed understanding.   Education regarding symptom management and diagnosis given to patient on AVS either electronically or printed.  Return in about 6 weeks (around 02/22/2023) for Complete Physical Exam.  Mable Paris, FNP  Subjective:    Patient ID: Jodi Hawkins, female    DOB: [birth year], 52 y.o.   MRN: [id number]  CC: Jodi Hawkins is a 52 y.o. female who presents today for follow up.   HPI: Fatigue is present all the time.  Sleep is not restorative. Temporary improvement on b12.  No exercise as limited by low back pain.   Complains of low back pain, straight across her back.  No dysuria, hematuria, saddle anesthesia, numbness, trouble urinating or having a bowel movement, fever  She had felt better on mobic but she stopped a couple of months ago due to concern of NSAIDs use.   She  is taking ibuprofen '600mg'$  QD with relief.      Due for b12 injection  Started on metformin '2000mg'$  BID for weight loss.   Due for colonoscopy, pap smear,  and mammogram  Former smoker Allergies: Patient has no known allergies. Current Outpatient Medications on File Prior to Visit  Medication Sig Dispense Refill   metFORMIN (GLUCOPHAGE) 500 MG tablet Take 2 tablets (1,000 mg total) by mouth 2 (two) times daily with a meal. 360 tablet 3   Olopatadine HCl 0.2 % SOLN Apply 1 drop to eye daily as needed. 2.5 mL 0   sodium chloride (OCEAN) 0.65 % SOLN nasal spray Place 2 sprays into both nostrils daily as needed for congestion. 30 mL 2   traZODone  (DESYREL) 100 MG tablet TAKE 1 TABLET BY MOUTH EVERY NIGHT AT BEDTIME 90 tablet 1   No current facility-administered medications on file prior to visit.    Review of Systems  Constitutional:  Positive for fatigue. Negative for chills and fever.  Respiratory:  Negative for cough.   Cardiovascular:  Negative for chest pain and palpitations.  Gastrointestinal:  Negative for nausea and vomiting.  Musculoskeletal:  Positive for back pain.  Neurological:  Negative for numbness.      Objective:    BP 110/78   Pulse 85   Temp 98.3 F (36.8 C) (Oral)   Ht '5\' 2"'$  (1.575 m)   Wt 192 lb 12.8 oz (87.5 kg)   SpO2 96%   BMI 35.26 kg/m  BP Readings from Last 3 Encounters:  01/11/23 110/78  10/03/22 104/70  01/21/22 118/76   Wt Readings from Last 3 Encounters:  01/11/23 192 lb 12.8 oz (87.5 kg)  10/03/22 197 lb 6.4 oz (89.5 kg)  08/10/22 200 lb (90.7 kg)    Physical Exam Vitals reviewed.  Constitutional:      Appearance: She is well-developed.  Eyes:     Conjunctiva/sclera: Conjunctivae normal.  Cardiovascular:     Rate and Rhythm: Normal rate and regular rhythm.     Pulses: Normal pulses.     Heart sounds: Normal heart sounds.  Pulmonary:     Effort: Pulmonary effort is normal.     Breath sounds: Normal breath sounds. No wheezing, rhonchi or rales.  Musculoskeletal:     Lumbar back: No swelling, edema, spasms, tenderness or bony tenderness. Normal range of motion.     Comments: Full range of motion with flexion, tension, lateral side bends. No bony tenderness. No pain, numbness, tingling elicited with single leg raise bilaterally.   Lymphadenopathy:     Head:     Right side of head: No submental, submandibular, tonsillar, preauricular, posterior auricular or occipital adenopathy.     Left side of head: No submental, submandibular, tonsillar, preauricular, posterior auricular or occipital adenopathy.     Cervical:     Right cervical: No superficial cervical adenopathy.     Left cervical: No superficial cervical adenopathy.     Upper Body:     Right upper body: No supraclavicular adenopathy.     Left upper body: No supraclavicular adenopathy.  Skin:    General: Skin is warm and dry.  Neurological:     Mental Status: She is alert.     Sensory: No sensory deficit.     Deep Tendon Reflexes:     Reflex Scores:      Patellar reflexes are 2+ on the right side and 2+ on the left side.    Comments: Sensation and strength intact bilateral lower  extremities.  Psychiatric:        Speech: Speech normal.        Behavior: Behavior normal.        Thought Content: Thought content normal.

## 2023-01-11 NOTE — Assessment & Plan Note (Signed)
Acute on chronic.  Pending thoracic, lumbar x-ray.  Trial of Cymbalta 30 mg.  Patient will stop Lexapro.  Advised exercise caution in regards to NSAID use.  I refilled meloxicam for her to use as needed.  Encouraged patient, Salonpas pain patch.  She will start physical therapy

## 2023-01-11 NOTE — Assessment & Plan Note (Signed)
Chronic, persistent.  Concern for underlying sleep apnea as sleep is not restorative.  Suspect fatigue may also be multifactorial as she is limited in regards to exercise due to low back pain.  Long discussion as it relates to fatigue and the importance of ensuring preventative maintenance is up-to-date.  She will schedule her mammogram.  Colonoscopy referral has been placed as well as CT lung cancer screen.  She will return for Pap

## 2023-01-12 ENCOUNTER — Telehealth: Payer: Self-pay

## 2023-01-12 ENCOUNTER — Other Ambulatory Visit: Payer: Self-pay

## 2023-01-12 ENCOUNTER — Telehealth: Payer: Self-pay | Admitting: Gastroenterology

## 2023-01-12 DIAGNOSIS — Z1211 Encounter for screening for malignant neoplasm of colon: Secondary | ICD-10-CM

## 2023-01-12 LAB — CBC WITH DIFFERENTIAL/PLATELET
Basophils Absolute: 0.1 10*3/uL (ref 0.0–0.1)
Basophils Relative: 1.1 % (ref 0.0–3.0)
Eosinophils Absolute: 0 10*3/uL (ref 0.0–0.7)
Eosinophils Relative: 0.3 % (ref 0.0–5.0)
HCT: 42.4 % (ref 36.0–46.0)
Hemoglobin: 14.7 g/dL (ref 12.0–15.0)
Lymphocytes Relative: 41 % (ref 12.0–46.0)
Lymphs Abs: 2.4 10*3/uL (ref 0.7–4.0)
MCHC: 34.6 g/dL (ref 30.0–36.0)
MCV: 88.5 fl (ref 78.0–100.0)
Monocytes Absolute: 0.4 10*3/uL (ref 0.1–1.0)
Monocytes Relative: 6.5 % (ref 3.0–12.0)
Neutro Abs: 3 10*3/uL (ref 1.4–7.7)
Neutrophils Relative %: 51.1 % (ref 43.0–77.0)
Platelets: 187 10*3/uL (ref 150.0–400.0)
RBC: 4.79 Mil/uL (ref 3.87–5.11)
RDW: 13.4 % (ref 11.5–15.5)
WBC: 5.9 10*3/uL (ref 4.0–10.5)

## 2023-01-12 LAB — IRON,TIBC AND FERRITIN PANEL
%SAT: 20 % (calc) (ref 16–45)
Ferritin: 80 ng/mL (ref 16–232)
Iron: 67 ug/dL (ref 45–160)
TIBC: 334 mcg/dL (calc) (ref 250–450)

## 2023-01-12 MED ORDER — NA SULFATE-K SULFATE-MG SULF 17.5-3.13-1.6 GM/177ML PO SOLN: 1.0000 | Freq: Once | ORAL | 0 refills | Status: AC

## 2023-01-12 NOTE — Telephone Encounter (Signed)
Patient calling you back to schedule colonoscopy.

## 2023-01-12 NOTE — Telephone Encounter (Signed)
Gastroenterology Pre-Procedure Review  Request Date: 01/24/23 Requesting Physician: Dr. Vicente Males  PATIENT REVIEW QUESTIONS: The patient responded to the following health history questions as indicated:    1. Are you having any GI issues? no 2. Do you have a personal history of Polyps? no 3. Do you have a family history of Colon Cancer or Polyps? no 4. Diabetes Mellitus? no 5. Joint replacements in the past 12 months?no 6. Major health problems in the past 3 months?no 7. Any artificial heart valves, MVP, or defibrillator?no    MEDICATIONS & ALLERGIES:    Patient reports the following regarding taking any anticoagulation/antiplatelet therapy:   Plavix, Coumadin, Eliquis, Xarelto, Lovenox, Pradaxa, Brilinta, or Effient? no Aspirin? no  Patient confirms/reports the following medications:  Current Outpatient Medications  Medication Sig Dispense Refill   DULoxetine (CYMBALTA) 30 MG capsule Take 1 capsule (30 mg total) by mouth daily. 30 capsule 3   meloxicam (MOBIC) 7.5 MG tablet TAKE 1 TABLET(7.5 MG) BY MOUTH DAILY AS NEEDED FOR PAIN 30 tablet 1   metFORMIN (GLUCOPHAGE) 500 MG tablet Take 2 tablets (1,000 mg total) by mouth 2 (two) times daily with a meal. 360 tablet 3   Olopatadine HCl 0.2 % SOLN Apply 1 drop to eye daily as needed. 2.5 mL 0   sodium chloride (OCEAN) 0.65 % SOLN nasal spray Place 2 sprays into both nostrils daily as needed for congestion. 30 mL 2   traZODone (DESYREL) 100 MG tablet TAKE 1 TABLET BY MOUTH EVERY NIGHT AT BEDTIME 90 tablet 1   No current facility-administered medications for this visit.    Patient confirms/reports the following allergies:  No Known Allergies  No orders of the defined types were placed in this encounter.   AUTHORIZATION INFORMATION Primary Insurance: 1D#: Group #:  Secondary Insurance: 1D#: Group #:  SCHEDULE INFORMATION: Date: 01/24/23 Time: Location: [facility]

## 2023-01-16 ENCOUNTER — Telehealth: Payer: Self-pay

## 2023-01-16 NOTE — Telephone Encounter (Signed)
Colonoscopy has been rescheduled to 01/31/23 due to her husband not being able to take off work.  Trish in Endo has been notified of date change.  Thanks, [place], [place]

## 2023-01-16 NOTE — Telephone Encounter (Signed)
Needs to reschedule colonoscopy

## 2023-01-23 ENCOUNTER — Telehealth: Payer: Self-pay | Admitting: Gastroenterology

## 2023-01-23 NOTE — Telephone Encounter (Signed)
Patient called and states her husband has to teach on schedule colonoscopy day. Reschedule to 02/09/2023. Called endo and talk to [place] and she will get patient moved. Sent out new instructions to patient

## 2023-01-23 NOTE — Telephone Encounter (Signed)
Patient calling to reschedule colonoscopy.  

## 2023-02-01 ENCOUNTER — Ambulatory Visit
Admission: RE | Admit: 2023-02-01 | Discharge: 2023-02-01 | Disposition: A | Discharge status: Home / Self Care | Payer: BC Managed Care – PPO | Source: Ambulatory Visit | Attending: Family | Admitting: Family

## 2023-02-01 DIAGNOSIS — Z1231 Encounter for screening mammogram for malignant neoplasm of breast: Secondary | ICD-10-CM | POA: Diagnosis not present

## 2023-02-06 ENCOUNTER — Telehealth: Payer: Self-pay

## 2023-02-06 NOTE — Telephone Encounter (Signed)
Lm for patient to ask if she has had previous sleep study or if she wears cpap.

## 2023-02-07 ENCOUNTER — Institutional Professional Consult (permissible substitution): Payer: BC Managed Care – PPO | Admitting: Internal Medicine

## 2023-02-07 NOTE — Telephone Encounter (Signed)
Lm x2 for patient.  Will close encounter per office protocol.   

## 2023-02-08 ENCOUNTER — Encounter: Payer: Self-pay | Admitting: Gastroenterology

## 2023-02-09 ENCOUNTER — Ambulatory Visit: Payer: BC Managed Care – PPO | Admitting: Anesthesiology

## 2023-02-09 ENCOUNTER — Encounter: Payer: Self-pay | Admitting: Gastroenterology

## 2023-02-09 ENCOUNTER — Ambulatory Visit
Admission: RE | Admit: 2023-02-09 | Discharge: 2023-02-09 | Disposition: A | Discharge status: Home / Self Care | Payer: BC Managed Care – PPO | Attending: Gastroenterology | Admitting: Gastroenterology

## 2023-02-09 ENCOUNTER — Encounter
Admission: RE | Disposition: A | Discharge status: Home / Self Care | Payer: Self-pay | Source: Home / Self Care | Attending: Gastroenterology

## 2023-02-09 DIAGNOSIS — Z7984 Long term (current) use of oral hypoglycemic drugs: Secondary | ICD-10-CM | POA: Insufficient documentation

## 2023-02-09 DIAGNOSIS — E669 Obesity, unspecified: Secondary | ICD-10-CM | POA: Diagnosis not present

## 2023-02-09 DIAGNOSIS — Z1211 Encounter for screening for malignant neoplasm of colon: Secondary | ICD-10-CM | POA: Insufficient documentation

## 2023-02-09 DIAGNOSIS — F419 Anxiety disorder, unspecified: Secondary | ICD-10-CM | POA: Diagnosis not present

## 2023-02-09 DIAGNOSIS — Z8616 Personal history of COVID-19: Secondary | ICD-10-CM | POA: Diagnosis not present

## 2023-02-09 DIAGNOSIS — E119 Type 2 diabetes mellitus without complications: Secondary | ICD-10-CM | POA: Diagnosis not present

## 2023-02-09 DIAGNOSIS — D125 Benign neoplasm of sigmoid colon: Secondary | ICD-10-CM | POA: Insufficient documentation

## 2023-02-09 DIAGNOSIS — Z09 Encounter for follow-up examination after completed treatment for conditions other than malignant neoplasm: Secondary | ICD-10-CM | POA: Insufficient documentation

## 2023-02-09 DIAGNOSIS — D126 Benign neoplasm of colon, unspecified: Secondary | ICD-10-CM

## 2023-02-09 DIAGNOSIS — Z87891 Personal history of nicotine dependence: Secondary | ICD-10-CM | POA: Diagnosis not present

## 2023-02-09 DIAGNOSIS — Z6834 Body mass index (BMI) 34.0-34.9, adult: Secondary | ICD-10-CM | POA: Diagnosis not present

## 2023-02-09 DIAGNOSIS — K635 Polyp of colon: Secondary | ICD-10-CM | POA: Diagnosis not present

## 2023-02-09 DIAGNOSIS — Z0000 Encounter for general adult medical examination without abnormal findings: Secondary | ICD-10-CM

## 2023-02-09 HISTORY — PX: COLONOSCOPY WITH PROPOFOL: SHX5780

## 2023-02-09 HISTORY — DX: Type 2 diabetes mellitus without complications: E11.9

## 2023-02-09 LAB — GLUCOSE, CAPILLARY: Glucose-Capillary: 118 mg/dL — ABNORMAL HIGH (ref 70–99)

## 2023-02-09 SURGERY — COLONOSCOPY WITH PROPOFOL
Anesthesia: General

## 2023-02-09 MED ORDER — PROPOFOL 10 MG/ML IV BOLUS
INTRAVENOUS | Status: DC | PRN
  Administered 2023-02-09: 80 mg via INTRAVENOUS
  Administered 2023-02-09: 20 mg via INTRAVENOUS

## 2023-02-09 MED ORDER — SODIUM CHLORIDE 0.9 % IV SOLN: INTRAVENOUS | Status: DC

## 2023-02-09 MED ORDER — PROPOFOL 500 MG/50ML IV EMUL
INTRAVENOUS | Status: DC | PRN
  Administered 2023-02-09: 140 ug/kg/min via INTRAVENOUS

## 2023-02-09 MED ORDER — LIDOCAINE HCL (CARDIAC) PF 100 MG/5ML IV SOSY
PREFILLED_SYRINGE | INTRAVENOUS | Status: DC | PRN
  Administered 2023-02-09: 60 mg via INTRAVENOUS

## 2023-02-09 MED ORDER — PHENYLEPHRINE HCL (PRESSORS) 10 MG/ML IV SOLN
INTRAVENOUS | Status: DC | PRN
  Administered 2023-02-09: 80 ug via INTRAVENOUS

## 2023-02-09 NOTE — Anesthesia Preprocedure Evaluation (Signed)
Anesthesia Evaluation  Patient identified by MRN, date of birth, ID band Patient awake    Reviewed: Allergy & Precautions, NPO status , Patient's Chart, lab work & pertinent test results  Airway Mallampati: III  TM Distance: >3 FB Neck ROM: full    Dental  (+) Teeth Intact   Pulmonary neg pulmonary ROS, Patient abstained from smoking., former smoker   Pulmonary exam normal breath sounds clear to auscultation       Cardiovascular Exercise Tolerance: Good negative cardio ROS Normal cardiovascular exam Rhythm:Regular     Neuro/Psych  Headaches PSYCHIATRIC DISORDERS Anxiety     negative neurological ROS  negative psych ROS   GI/Hepatic negative GI ROS, Neg liver ROS,,,  Endo/Other  negative endocrine ROSdiabetes, Type 2, Oral Hypoglycemic Agents    Renal/GU negative Renal ROS  negative genitourinary   Musculoskeletal   Abdominal  (+) + obese  Peds negative pediatric ROS (+)  Hematology negative hematology ROS (+)   Anesthesia Other Findings Past Medical History: No date: Anxiety No date: COVID-19     Comment:  09/2020 No date: Diabetes mellitus without complication (HCC) No date: Migraines  Past Surgical History: No date: CARPAL TUNNEL RELEASE  BMI    Body Mass Index: 34.75 kg/m      Reproductive/Obstetrics negative OB ROS                             Anesthesia Physical Anesthesia Plan  ASA: 2  Anesthesia Plan: General   Post-op Pain Management:    Induction: Intravenous  PONV Risk Score and Plan: Propofol infusion and TIVA  Airway Management Planned: Natural Airway and Nasal Cannula  Additional Equipment:   Intra-op Plan:   Post-operative Plan:   Informed Consent: I have reviewed the patients History and Physical, chart, labs and discussed the procedure including the risks, benefits and alternatives for the proposed anesthesia with the patient or authorized  representative who has indicated his/her understanding and acceptance.     Dental Advisory Given  Plan Discussed with: CRNA and Surgeon  Anesthesia Plan Comments:        Anesthesia Quick Evaluation

## 2023-02-09 NOTE — Anesthesia Postprocedure Evaluation (Signed)
Anesthesia Post Note  Patient: Jodi Hawkins  Procedure(s) Performed: COLONOSCOPY WITH PROPOFOL  Patient location during evaluation: PACU Anesthesia Type: General Level of consciousness: awake and awake and alert Pain management: pain level controlled Vital Signs Assessment: post-procedure vital signs reviewed and stable Respiratory status: spontaneous breathing Cardiovascular status: stable Anesthetic complications: no   No notable events documented.   Last Vitals:  Vitals:   02/09/23 0927 02/09/23 0937  BP: 101/69 111/72  Pulse: 71 73  Resp: 11 15  Temp:    SpO2: 96% 96%    Last Pain:  Vitals:   02/09/23 0937  TempSrc:   PainSc: 0-No pain                 VAN STAVEREN,Jodi Kirshner

## 2023-02-09 NOTE — H&P (Signed)
Jonathon Bellows, MD [address] [place], [place], [place], [address] [address], [place], [place], [place], [address] Phone: [phone]  Fax: [phone]  Primary Care Physician:  Burnard Hawthorne, FNP   Pre-Procedure History & Physical: HPI:  Jodi Hawkins is a 52 y.o. female is here for an colonoscopy.   Past Medical History:  Diagnosis Date   Anxiety    COVID-19    09/2020   Diabetes mellitus without complication ([place])    Migraines     Past Surgical History:  Procedure Laterality Date   CARPAL TUNNEL RELEASE      Prior to Admission medications   Medication Sig Start Date End Date Taking? Authorizing Provider  DULoxetine (CYMBALTA) 30 MG capsule Take 1 capsule (30 mg total) by mouth daily. 01/11/23  Yes Burnard Hawthorne, FNP  metFORMIN (GLUCOPHAGE) 500 MG tablet Take 2 tablets (1,000 mg total) by mouth 2 (two) times daily with a meal. 10/04/22  Yes Burnard Hawthorne, FNP  traZODone (DESYREL) 100 MG tablet TAKE 1 TABLET BY MOUTH EVERY NIGHT AT BEDTIME 10/06/22  Yes Burnard Hawthorne, FNP  meloxicam (MOBIC) 7.5 MG tablet TAKE 1 TABLET(7.5 MG) BY MOUTH DAILY AS NEEDED FOR PAIN 01/11/23   Burnard Hawthorne, FNP  Olopatadine HCl 0.2 % SOLN Apply 1 drop to eye daily as needed. 05/05/21   McLean-Scocuzza, Nino Glow, MD  sodium chloride (OCEAN) 0.65 % SOLN nasal spray Place 2 sprays into both nostrils daily as needed for congestion. 05/05/21   McLean-Scocuzza, Nino Glow, MD    Allergies as of 01/12/2023   (No Known Allergies)    Family History  Problem Relation Age of Onset   Hypertension Mother    Thyroid disease Sister    Hypertension Paternal Grandmother    Hypertension Paternal Grandfather    Breast cancer Neg Hx    Thyroid cancer Neg Hx     Social History   Socioeconomic History   Marital status: Married    Spouse name: Not on file   Number of children: Not on file   Years of education: Not on file   Highest education level: Not on file   Occupational History   Not on file  Tobacco Use   Smoking status: Former    Types: Cigarettes    Quit date: 06/18/2012    Years since quitting: 10.6   Smokeless tobacco: Never  Vaping Use   Vaping Use: Never used  Substance and Sexual Activity   Alcohol use: No    Alcohol/week: 0.0 standard drinks of alcohol   Drug use: No   Sexual activity: Yes    Partners: Male    Birth control/protection: Post-menopausal    Comment: Husband   Other Topics Concern   Not on file  Social History Narrative   Married    [place] from [place]  To [place] 2015.   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    Children- 2 boys (1 adopted)     Caffeine- Coffee- 1 20 oz, rare soda    In school for real estate .   Social Determinants of Health   Financial Resource Strain: Not on file  Food Insecurity: Not on file  Transportation Needs: Not on file  Physical Activity: Not on file  Stress: Not on file  Social Connections: Not on file  Intimate Partner Violence: Not on file    Review of Systems: See HPI, otherwise negative ROS  Physical Exam:  There were no vitals taken for this visit. General:   Alert,  pleasant and cooperative in NAD Head:  Normocephalic and atraumatic. Neck:  Supple; no masses or thyromegaly. Lungs:  Clear throughout to auscultation, normal respiratory effort.    Heart:  +S1, +S2, Regular rate and rhythm, No edema. Abdomen:  Soft, nontender and nondistended. Normal bowel sounds, without guarding, and without rebound.   Neurologic:  Alert and  oriented x4;  grossly normal neurologically.  Impression/Plan: Jodi Hawkins is here for an colonoscopy to be performed for Screening colonoscopy average risk   Risks, benefits, limitations, and alternatives regarding  colonoscopy have been reviewed with the patient.  Questions have been answered.  All parties agreeable.   Jonathon Bellows, MD  02/09/2023, 8:16 AM

## 2023-02-09 NOTE — Op Note (Signed)
[facility] Patient Name: Jodi Hawkins Procedure Date: 02/09/2023 8:59 AM MRN: [id number] Account #: [id number] Date of Birth: Mar 23, 1971 Admit Type: Outpatient Age: 52 Room: [facility] ENDO ROOM 4 Gender: Female Note Status: Finalized Instrument Name: Jasper Riling [id number] Procedure:             Colonoscopy Indications:           Screening for colorectal malignant neoplasm Providers:             Jonathon Bellows MD, MD Referring MD:          Yvetta Coder. Arnett (Referring MD) Medicines:             Monitored Anesthesia Care Complications:         No immediate complications. Procedure:             Pre-Anesthesia Assessment:                        - Prior to the procedure, a History and Physical was                         performed, and patient medications, allergies and                         sensitivities were reviewed. The patient's tolerance                         of previous anesthesia was reviewed.                        - The risks and benefits of the procedure and the                         sedation options and risks were discussed with the                         patient. All questions were answered and informed                         consent was obtained.                        - ASA Grade Assessment: II - A patient with mild                         systemic disease.                        After obtaining informed consent, the colonoscope was                         passed under direct vision. Throughout the procedure,                         the patient's blood pressure, pulse, and oxygen                         saturations were monitored continuously. The                         Colonoscope was introduced  through the anus and                         advanced to the the cecum, identified by the                         appendiceal orifice. The colonoscopy was performed                         with ease. The patient tolerated the procedure well.                          The quality of the bowel preparation was excellent.                         The ileocecal valve, appendiceal orifice, and rectum                         were photographed. Findings:      The perianal and digital rectal examinations were normal.      A 5 mm polyp was found in the sigmoid colon. The polyp was sessile. The       polyp was removed with a cold snare. Resection and retrieval were       complete.      The exam was otherwise without abnormality on direct and retroflexion       views. Impression:            - One 5 mm polyp in the sigmoid colon, removed with a                         cold snare. Resected and retrieved.                        - The examination was otherwise normal on direct and                         retroflexion views. Recommendation:        - Discharge patient to home (with escort).                        - Resume previous diet.                        - Continue present medications.                        - Await pathology results.                        - Repeat colonoscopy for surveillance based on                         pathology results. Procedure Code(s):     --- Professional ---                        [phone], Colonoscopy, flexible; with removal of                         tumor(s), polyp(s), or other lesion(s) by snare  technique Diagnosis Code(s):     --- Professional ---                        Z12.11, Encounter for screening for malignant neoplasm                         of colon                        D12.5, Benign neoplasm of sigmoid colon CPT copyright 2022 American Medical Association. All rights reserved. The codes documented in this report are preliminary and upon coder review may  be revised to meet current compliance requirements. Jonathon Bellows, MD Jonathon Bellows MD, MD 02/09/2023 9:14:57 AM This report has been signed electronically. Number of Addenda: 0 Note Initiated On: 02/09/2023 8:59 AM Scope  Withdrawal Time: 0 hours 6 minutes 55 seconds  Total Procedure Duration: 0 hours 8 minutes 54 seconds  Estimated Blood Loss:  Estimated blood loss: none.      [facility]

## 2023-02-09 NOTE — Transfer of Care (Signed)
Immediate Anesthesia Transfer of Care Note  Patient: Jodi Hawkins  Procedure(s) Performed: COLONOSCOPY WITH PROPOFOL  Patient Location: PACU  Anesthesia Type:General  Level of Consciousness: awake, alert , and oriented  Airway & Oxygen Therapy: Patient Spontanous Breathing  Post-op Assessment: Report given to RN and Post -op Vital signs reviewed and stable  Post vital signs: Reviewed and stable  Last Vitals:  Vitals Value Taken Time  BP 86/48 02/09/23 0917  Temp 36.4 C 02/09/23 0917  Pulse 77 02/09/23 0917  Resp 12 02/09/23 0917  SpO2 92 % 02/09/23 0917    Last Pain:  Vitals:   02/09/23 0917  TempSrc: Temporal  PainSc: 0-No pain         Complications: No notable events documented.

## 2023-02-10 ENCOUNTER — Encounter: Payer: Self-pay | Admitting: Gastroenterology

## 2023-02-10 LAB — SURGICAL PATHOLOGY

## 2023-02-15 ENCOUNTER — Ambulatory Visit (INDEPENDENT_AMBULATORY_CARE_PROVIDER_SITE_OTHER): Payer: BC Managed Care – PPO | Admitting: Family

## 2023-02-15 ENCOUNTER — Other Ambulatory Visit (HOSPITAL_COMMUNITY)
Admission: RE | Admit: 2023-02-15 | Discharge: 2023-02-15 | Disposition: A | Discharge status: Home / Self Care | Payer: BC Managed Care – PPO | Source: Ambulatory Visit | Attending: Family | Admitting: Family

## 2023-02-15 ENCOUNTER — Encounter: Payer: Self-pay | Admitting: Family

## 2023-02-15 ENCOUNTER — Other Ambulatory Visit: Payer: Self-pay | Admitting: *Deleted

## 2023-02-15 ENCOUNTER — Other Ambulatory Visit: Payer: Self-pay

## 2023-02-15 VITALS — BP 110/80 | HR 103 | Temp 98.1°F | Ht 62.0 in | Wt 188.4 lb

## 2023-02-15 DIAGNOSIS — Z6835 Body mass index (BMI) 35.0-35.9, adult: Secondary | ICD-10-CM

## 2023-02-15 DIAGNOSIS — Z124 Encounter for screening for malignant neoplasm of cervix: Secondary | ICD-10-CM | POA: Diagnosis not present

## 2023-02-15 DIAGNOSIS — Z0000 Encounter for general adult medical examination without abnormal findings: Secondary | ICD-10-CM | POA: Diagnosis not present

## 2023-02-15 DIAGNOSIS — M545 Low back pain, unspecified: Secondary | ICD-10-CM

## 2023-02-15 DIAGNOSIS — G8929 Other chronic pain: Secondary | ICD-10-CM

## 2023-02-15 DIAGNOSIS — F411 Generalized anxiety disorder: Secondary | ICD-10-CM

## 2023-02-15 DIAGNOSIS — R7303 Prediabetes: Secondary | ICD-10-CM

## 2023-02-15 DIAGNOSIS — G4700 Insomnia, unspecified: Secondary | ICD-10-CM

## 2023-02-15 DIAGNOSIS — E538 Deficiency of other specified B group vitamins: Secondary | ICD-10-CM | POA: Diagnosis not present

## 2023-02-15 DIAGNOSIS — Z122 Encounter for screening for malignant neoplasm of respiratory organs: Secondary | ICD-10-CM

## 2023-02-15 DIAGNOSIS — Z87891 Personal history of nicotine dependence: Secondary | ICD-10-CM

## 2023-02-15 MED ORDER — DULOXETINE HCL 30 MG PO CPEP: 30.0000 mg | ORAL_CAPSULE | Freq: Every day | ORAL | 3 refills | Status: DC

## 2023-02-15 MED ORDER — WEGOVY 0.25 MG/0.5ML ~~LOC~~ SOAJ: 0.2500 mg | SUBCUTANEOUS | 1 refills | Status: DC

## 2023-02-15 MED ORDER — TRAZODONE HCL 100 MG PO TABS: 100.0000 mg | ORAL_TABLET | Freq: Every day | ORAL | 3 refills | Status: DC

## 2023-02-15 MED ORDER — CYANOCOBALAMIN 1000 MCG/ML IJ SOLN
1000.0000 ug | Freq: Once | INTRAMUSCULAR | Status: AC
  Administered 2023-02-15: 1000 ug via INTRAMUSCULAR

## 2023-02-15 NOTE — Patient Instructions (Addendum)
You are due for annual lung cancer screening program.      I have placed a referral to [facility]  and their office will reach out to you to schedule your CT of your chest.  They will reach out to you annually going forward.  If you do not hear from their office in the next 1 to 2 weeks, please call [facility] at [phone]   So let me know if there are any issues in getting scheduled.    We have discussed starting non insulin daily injectable medication called Wegovy  which is a glucagon like peptide (GLP 1) agonist and works by delaying gastric emptying and increasing insulin secretion.It is given once per week. Most patients see significant weight loss with this drug class.   You may NOT take either medication if you or your family has history of thyroid, parathyroid, OR adrenal cancer. Please confirm you and your family does NOT have this history as this drug class has black box warning on this medication for that reason.   Please follow  directions on prescription and slowly increase from 0.'25mg'$  [place]-Ch[place]ol[place] once per week ;stay here for 4 weeks. You may then increase to 0.'5mg'$  [place] once per week and stay there for 4 weeks.  We can slowly titrate further at follow up with goal of no more than 1-2 lbs weight loss per week.  Semaglutide [facility])  Dose (mg) Once Weekly Titration:    If a dose is not tolerated, consider delaying further dose increases for  another 4 weeks.  If you are actively losing weight on a dose, do not increase medication.   Weeks 1 through 4  0.25 mg once weekly  Weeks 5 through 8  0.5 mg once weekly  Weeks 9 though 12  1 mg once weekly  Weeks 13 through 16  1.7 mg once weekly   Semaglutide Injection (Weight Management) What is this medication? SEMAGLUTIDE (SEM a GLOO tide) promotes weight loss. It may also be used to maintain weight loss. It works by decreasing appetite. Changes to diet and exercise are often combined with this  medication. This medicine may be used for other purposes; ask your health care provider or pharmacist if you have questions. COMMON BRAND NAME(S): [id number] What should I tell my care team before I take this medication? They need to know if you have any of these conditions: Endocrine tumors (MEN 2) or if someone in your family had these tumors Eye disease, vision problems Gallbladder disease History of depression or mental health disease History of pancreatitis Kidney disease Stomach or intestine problems Suicidal thoughts, plans, or attempt; a previous suicide attempt by you or a family member Thyroid cancer or if someone in your family had thyroid cancer An unusual or allergic reaction to semaglutide, other medications, foods, dyes, or preservatives Pregnant or trying to get pregnant Breast-feeding How should I use this medication? This medication is injected under the skin. You will be taught how to prepare and give it. Take it as directed on the prescription label. It is given once every week (every 7 days). Keep taking it unless your care team tells you to stop. It is important that you put your used needles and pens in a special sharps container. Do not put them in a trash can. If you do not have a sharps container, call your pharmacist or care team to get one. A special MedGuide will be given to you by the pharmacist  with each prescription and refill. Be sure to read this information carefully each time. This medication comes with INSTRUCTIONS FOR USE. Ask your pharmacist for directions on how to use this medication. Read the information carefully. Talk to your pharmacist or care team if you have questions. Talk to your care team about the use of this medication in children. While it may be prescribed for children as Broce as 12 years for selected conditions, precautions do apply. Overdosage: If you think you have taken too much of this medicine contact a poison control center or emergency  room at once. NOTE: This medicine is only for you. Do not share this medicine with others. What if I miss a dose? If you miss a dose and the next scheduled dose is more than 2 days away, take the missed dose as soon as possible. If you miss a dose and the next scheduled dose is less than 2 days away, do not take the missed dose. Take the next dose at your regular time. Do not take double or extra doses. If you miss your dose for 2 weeks or more, take the next dose at your regular time or call your care team to talk about how to restart this medication. What may interact with this medication? Insulin and other medications for diabetes This list may not describe all possible interactions. Give your health care provider a list of all the medicines, herbs, non-prescription drugs, or dietary supplements you use. Also tell them if you smoke, drink alcohol, or use illegal drugs. Some items may interact with your medicine. What should I watch for while using this medication? Visit your care team for regular checks on your progress. It may be some time before you see the benefit from this medication. Drink plenty of fluids while taking this medication. Check with your care team if you have severe diarrhea, nausea, and vomiting, or if you sweat a lot. The loss of too much body fluid may make it dangerous for you to take this medication. This medication may affect blood sugar levels. Ask your care team if changes in diet or medications are needed if you have diabetes. If you or your family notice any changes in your behavior, such as new or worsening depression, thoughts of harming yourself, anxiety, other unusual or disturbing thoughts, or memory loss, call your care team right away. Women should inform their care team if they wish to become pregnant or think they might be pregnant. Losing weight while pregnant is not advised and may cause harm to the unborn child. Talk to your care team for more  information. What side effects may I notice from receiving this medication? Side effects that you should report to your care team as soon as possible: Allergic reactions--skin rash, itching, hives, swelling of the face, lips, tongue, or throat Change in vision Dehydration--increased thirst, dry mouth, feeling faint or lightheaded, headache, dark yellow or brown urine Gallbladder problems--severe stomach pain, nausea, vomiting, fever Heart palpitations--rapid, pounding, or irregular heartbeat Kidney injury--decrease in the amount of urine, swelling of the ankles, hands, or feet Pancreatitis--severe stomach pain that spreads to your back or gets worse after eating or when touched, fever, nausea, vomiting Thoughts of suicide or self-harm, worsening mood, feelings of depression Thyroid cancer--new mass or lump in the neck, pain or trouble swallowing, trouble breathing, hoarseness Side effects that usually do not require medical attention (report to your care team if they continue or are bothersome): Diarrhea Loss of appetite Nausea Stomach pain  Vomiting This list may not describe all possible side effects. Call your doctor for medical advice about side effects. You may report side effects to FDA at 1-800-FDA-1088. Where should I keep my medication? Keep out of the reach of children and pets. Refrigeration (preferred): Store in the refrigerator. Do not freeze. Keep this medication in the original container until you are ready to take it. Get rid of any unused medication after the expiration date. Room temperature: If needed, prior to cap removal, the pen can be stored at room temperature for up to 28 days. Protect from light. If it is stored at room temperature, get rid of any unused medication after 28 days or after it expires, whichever is first. It is important to get rid of the medication as soon as you no longer need it or it is expired. You can do this in two ways: Take the medication to a  medication take-back program. Check with your pharmacy or law enforcement to find a location. If you cannot return the medication, follow the directions in the [facility]. NOTE: This sheet is a summary. It may not cover all possible information. If you have questions about this medicine, talk to your doctor, pharmacist, or health care provider.  2023 Elsevier/Gold Standard (2021-02-18 00:00:00)

## 2023-02-15 NOTE — Progress Notes (Signed)
Assessment & Plan:  B12 deficiency -     Cyanocobalamin  Screening for cervical cancer -     Cytology - PAP  Generalized anxiety disorder Assessment & Plan: Chronic, improved.  Continue Cymbalta 30 mg  Orders: -     DULoxetine HCl; Take 1 capsule (30 mg total) by mouth daily.  Dispense: 90 capsule; Refill: 3 -     traZODone HCl; Take 1 tablet (100 mg total) by mouth at bedtime.  Dispense: 90 tablet; Refill: 3  Chronic midline low back pain without sciatica -     DULoxetine HCl; Take 1 capsule (30 mg total) by mouth daily.  Dispense: 90 capsule; Refill: 3  Insomnia, unspecified type -     traZODone HCl; Take 1 tablet (100 mg total) by mouth at bedtime.  Dispense: 90 tablet; Refill: 3  Prediabetes Assessment & Plan: Metformin has not been particularly helpful for weight loss.  Patient interested in starting [facility]  Counseled on blackbox warning as it relates to multiple endocrine neoplasia, medullary thyroid cancer, administration, and side effects.  Patient will stop metformin and trial Wegovy.  Close follow-up   BMI 35.0-35.9,adult -     [id number]; Inject 0.25 mg into the skin once a week.  Dispense: 2 mL; Refill: 1  Annual physical exam Assessment & Plan: Clinical breast exam and Pap smear performed today.  Encouraged exercise.  Coordinated CT lung cancer screening for patient; she is now scheduled with pulmonology, Wyn Quaker      Return precautions given.   Risks, benefits, and alternatives of the medications and treatment plan prescribed today were discussed, and patient expressed understanding.   Education regarding symptom management and diagnosis given to patient on AVS either electronically or printed.  Return in about 6 months (around 08/16/2023).  Mable Paris, FNP  Subjective:    Patient ID: Nayeliz Decordova, female    DOB: February 19, 1971, 52 y.o.   MRN: [id number]  CC: Eathel Spahn is a 52 y.o. female who presents today for physical exam.    HPI: Anxiety  has improved on cymbalta  Trazodone has been helpful for sleep.   She been compliant with metformin 2000 mg a day with approximately 4 pound weight loss.  She started walking and eating healthier.  She would be interested in another option.  Denies personal or family history of thyroid cancer, multiple endocrine neoplasia  Colorectal Cancer Screening: UTD 02/09/23, Dr Vicente Males, repeat in 5 years Breast Cancer Screening: Mammogram UTD Cervical Cancer Screening: due   Lung Cancer Screening: due  .        Tetanus - due      Labs: Screening labs today. Exercise: Gets regular exercise, starting to walk daily.   Alcohol use:  none Smoking/tobacco use: former smoker.    Health Maintenance  Topic Date Due   DTaP/Tdap/Td (1 - Tdap) Never done   Lung Cancer Screening  Never done   PAP SMEAR-Modifier  02/21/2020   INFLUENZA VACCINE  03/19/2023 (Originally 07/19/2022)   MAMMOGRAM  02/01/2025   COLONOSCOPY (Pts 45-86yr Insurance coverage will need to be confirmed)  02/10/2028   Hepatitis C Screening  Completed   HIV Screening  Completed   HPV VACCINES  Aged Out   COVID-19 Vaccine  Discontinued   Zoster Vaccines- Shingrix  Discontinued    ALLERGIES: Patient has no known allergies.  Current Outpatient Medications on File Prior to Visit  Medication Sig Dispense Refill   meloxicam (MOBIC) 7.5 MG tablet TAKE 1 TABLET(7.5 MG) BY MOUTH  DAILY AS NEEDED FOR PAIN 30 tablet 1   No current facility-administered medications on file prior to visit.    Review of Systems  Constitutional:  Negative for chills and fever.  Respiratory:  Negative for cough.   Cardiovascular:  Negative for chest pain and palpitations.  Gastrointestinal:  Negative for nausea and vomiting.      Objective:    BP 110/80   Pulse (!) 103   Temp 98.1 F (36.7 C) (Oral)   Ht '5\' 2"'$  (1.575 m)   Wt 188 lb 6.4 oz (85.5 kg)   SpO2 96%   BMI 34.46 kg/m   BP Readings from Last 3 Encounters:  02/15/23 110/80  02/09/23  111/72  01/11/23 110/78   Wt Readings from Last 3 Encounters:  02/15/23 188 lb 6.4 oz (85.5 kg)  02/09/23 190 lb (86.2 kg)  01/11/23 192 lb 12.8 oz (87.5 kg)    Physical Exam Vitals reviewed.  Constitutional:      Appearance: Normal appearance. She is well-developed.  Eyes:     Conjunctiva/sclera: Conjunctivae normal.  Neck:     Thyroid: No thyroid mass or thyromegaly.  Cardiovascular:     Rate and Rhythm: Normal rate and regular rhythm.     Pulses: Normal pulses.     Heart sounds: Normal heart sounds.  Pulmonary:     Effort: Pulmonary effort is normal.     Breath sounds: Normal breath sounds. No wheezing, rhonchi or rales.  Chest:  Breasts:    Breasts are symmetrical.     Right: No inverted nipple, mass, nipple discharge, skin change or tenderness.     Left: No inverted nipple, mass, nipple discharge, skin change or tenderness.  Abdominal:     General: Bowel sounds are normal. There is no distension.     Palpations: Abdomen is soft. Abdomen is not rigid. There is no fluid wave or mass.     Tenderness: There is no abdominal tenderness. There is no guarding or rebound.  Genitourinary:    Cervix: No cervical motion tenderness, discharge or friability.     Uterus: Not enlarged, not fixed and not tender.      Adnexa:        Right: No mass, tenderness or fullness.         Left: No mass, tenderness or fullness.       Comments: Pap performed. No CMT. Unable to appreciated ovaries. Lymphadenopathy:     Head:     Right side of head: No submental, submandibular, tonsillar, preauricular, posterior auricular or occipital adenopathy.     Left side of head: No submental, submandibular, tonsillar, preauricular, posterior auricular or occipital adenopathy.     Cervical:     Right cervical: No superficial, deep or posterior cervical adenopathy.    Left cervical: No superficial, deep or posterior cervical adenopathy.     Upper Body:     Right upper body: No pectoral adenopathy.      Left upper body: No pectoral adenopathy.  Skin:    General: Skin is warm and dry.  Neurological:     Mental Status: She is alert.  Psychiatric:        Speech: Speech normal.        Behavior: Behavior normal.        Thought Content: Thought content normal.

## 2023-02-17 NOTE — Assessment & Plan Note (Signed)
Metformin has not been particularly helpful for weight loss.  Patient interested in starting [facility].  Counseled on blackbox warning as it relates to multiple endocrine neoplasia, medullary thyroid cancer, administration, and side effects.  Patient will stop metformin and trial Wegovy.  Close follow-up

## 2023-02-17 NOTE — Assessment & Plan Note (Signed)
Chronic, improved.  Continue Cymbalta 30 mg

## 2023-02-17 NOTE — Assessment & Plan Note (Addendum)
Clinical breast exam and Pap smear performed today.  Encouraged exercise.  Coordinated CT lung cancer screening for patient; she is now scheduled with pulmonology, Jodi Hawkins

## 2023-02-20 LAB — CYTOLOGY - PAP
Comment: NEGATIVE
Diagnosis: NEGATIVE
High risk HPV: NEGATIVE

## 2023-03-08 ENCOUNTER — Encounter: Payer: Self-pay | Admitting: Pulmonary Disease

## 2023-03-08 ENCOUNTER — Ambulatory Visit (INDEPENDENT_AMBULATORY_CARE_PROVIDER_SITE_OTHER): Payer: BC Managed Care – PPO | Admitting: Pulmonary Disease

## 2023-03-08 ENCOUNTER — Ambulatory Visit
Admission: RE | Admit: 2023-03-08 | Discharge: 2023-03-08 | Disposition: A | Discharge status: Home / Self Care | Payer: BC Managed Care – PPO | Source: Ambulatory Visit | Attending: Acute Care | Admitting: Acute Care

## 2023-03-08 DIAGNOSIS — Z122 Encounter for screening for malignant neoplasm of respiratory organs: Secondary | ICD-10-CM

## 2023-03-08 DIAGNOSIS — Z87891 Personal history of nicotine dependence: Secondary | ICD-10-CM | POA: Insufficient documentation

## 2023-03-08 NOTE — Progress Notes (Signed)
  Virtual Visit via Telephone Note  I connected with Jodi Hawkins on 03/08/23 at 11:00 AM EDT by telephone and verified that I am speaking with the correct person using two identifiers.  Location: Patient: HOME  Provider: [address] [place], [place], [place] [address]      Shared Decision Making Visit Lung Cancer Screening Program [phone])   Eligibility: Age 52 y.o. Pack Years Smoking History Calculation 45 (# packs/per year x # years smoked) Recent History of coughing up blood  no Unexplained weight loss? no ( >Than 15 pounds within the last 6 months ) Prior History Lung / other cancer no (Diagnosis within the last 5 years already requiring surveillance chest CT Scans). Smoking Status Former Smoker Former Smokers: Years since quit: 7 years  Quit Date: 2017  Visit Components: Discussion included one or more decision making aids. yes Discussion included risk/benefits of screening. yes Discussion included potential follow up diagnostic testing for abnormal scans. yes Discussion included meaning and risk of over diagnosis. yes Discussion included meaning and risk of False Positives. yes Discussion included meaning of total radiation exposure. yes  Counseling Included: Importance of adherence to annual lung cancer LDCT screening. yes Impact of comorbidities on ability to participate in the program. yes Ability and willingness to under diagnostic treatment. yes  Smoking Cessation Counseling: Former Smokers:  Discussed the importance of maintaining cigarette abstinence. yes Diagnosis Code: Personal History of Nicotine Dependence. [id number] Information about tobacco cessation classes and interventions provided to patient. Yes Patient provided with "ticket" for LDCT Scan. yes Written Order for Lung Cancer Screening with LDCT placed in Epic. Yes (CT Chest Lung Cancer Screening Low Dose W/O CM) [id number] Z12.2-Screening of respiratory organs Z87.891-Personal history of  nicotine dependence   Lauraine Rinne, NP

## 2023-03-08 NOTE — Patient Instructions (Signed)
Thank you for participating in the [facility] Lung Cancer Screening Program. It was our pleasure to meet you today. We will call you with the results of your scan within the next few days. Your scan will be assigned a Lung RADS category score by the physicians reading the scans.  This Lung RADS score determines follow up scanning.  See below for description of categories, and follow up screening recommendations. We will be in touch to schedule your follow up screening annually or based on recommendations of our providers. We will fax a copy of your scan results to your Primary Care Physician, or the physician who referred you to the program, to ensure they have the results. Please call the office if you have any questions or concerns regarding your scanning experience or results.  Our office number is [phone]. Please speak with Denise Phelps, RN. , or  Denise Buckner RN, They are  our Lung Cancer Screening RN.'s If They are unavailable when you call, Please leave a message on the voice mail. We will return your call at our earliest convenience.This voice mail is monitored several times a day.  Remember, if your scan is normal, we will scan you annually as long as you continue to meet the criteria for the program. (Age 50-80, Current smoker or smoker who has quit within the last 15 years). If you are a smoker, remember, quitting is the single most powerful action that you can take to decrease your risk of lung cancer and other pulmonary, breathing related problems. We know quitting is hard, and we are here to help.  Please let us know if there is anything we can do to help you meet your goal of quitting. If you are a former smoker, congratulations. We are proud of you! Remain smoke free! Remember you can refer friends or family members through the number above.  We will screen them to make sure they meet criteria for the program. Thank you for helping us take better care of you by  participating in Lung Screening.  You can receive free nicotine replacement therapy ( patches, gum or mints) by calling 1-800-QUIT NOW. Please call so we can get you on the path to becoming  a non-smoker. I know it is hard, but you can do this!  Lung RADS Categories:  Lung RADS 1: no nodules or definitely non-concerning nodules.  Recommendation is for a repeat annual scan in 12 months.  Lung RADS 2:  nodules that are non-concerning in appearance and behavior with a very low likelihood of becoming an active cancer. Recommendation is for a repeat annual scan in 12 months.  Lung RADS 3: nodules that are probably non-concerning , includes nodules with a low likelihood of becoming an active cancer.  Recommendation is for a 6-month repeat screening scan. Often noted after an upper respiratory illness. We will be in touch to make sure you have no questions, and to schedule your 6-month scan.  Lung RADS 4 A: nodules with concerning findings, recommendation is most often for a follow up scan in 3 months or additional testing based on our provider's assessment of the scan. We will be in touch to make sure you have no questions and to schedule the recommended 3 month follow up scan.  Lung RADS 4 B:  indicates findings that are concerning. We will be in touch with you to schedule additional diagnostic testing based on our provider's  assessment of the scan.  Other options for assistance in smoking cessation (   As covered by your insurance benefits)  Hypnosis for smoking cessation  [facility] [phone]  Acupuncture for smoking cessation  [facility] [phone]   

## 2023-03-09 ENCOUNTER — Telehealth: Payer: Self-pay | Admitting: Acute Care

## 2023-03-09 DIAGNOSIS — R9389 Abnormal findings on diagnostic imaging of other specified body structures: Secondary | ICD-10-CM

## 2023-03-09 NOTE — Telephone Encounter (Signed)
[facility] Call report. Pls call.

## 2023-03-09 NOTE — Telephone Encounter (Signed)
[facility] Radiology:  IMPRESSION: 1. Lung-RADS 1, negative. Continue annual screening with low-dose chest CT without contrast in 12 months. 2. Left-sided thyroid nodule of 2.2 cm. Recommend thyroid US (ref: J Am Coll Radiol. 2015 Feb;12(2): 143-50). 3. Subcentimeter left renal lesion, too small to characterize. Recommend attention on follow-up. 4.  Emphysema (ICD10-J43.9).

## 2023-03-13 ENCOUNTER — Other Ambulatory Visit: Payer: Self-pay | Admitting: Acute Care

## 2023-03-13 DIAGNOSIS — Z122 Encounter for screening for malignant neoplasm of respiratory organs: Secondary | ICD-10-CM

## 2023-03-13 DIAGNOSIS — Z87891 Personal history of nicotine dependence: Secondary | ICD-10-CM

## 2023-03-13 NOTE — Telephone Encounter (Signed)
Spoke with pt regarding CT results. No suspicious lung nodules seen. Will continue to monitor at yearly scan.  Thyroid nodule seen. Pt states she has not had any work up for this in the past. I advised pt I would sent this note to Mable Paris, NP to possibly order the thyroid ultrasound.  Also noted was a left renal lesion that has not been noted on any previous scans.  Pt is aware to contact PCP office if she has not heard back in a few days.  Order has been placed for 12 mth follow up lung screening CT.

## 2023-03-14 ENCOUNTER — Encounter: Payer: Self-pay | Admitting: Family

## 2023-03-14 NOTE — Telephone Encounter (Signed)
Noted I have sent pt mychart note  Jenate, would you call pt and see if she has seen? Please let me know if agreeable to thyroid and renal ultrasound

## 2023-03-14 NOTE — Telephone Encounter (Signed)
Spoke to pt and she is agreeable to the thyroid and renal ultrasound

## 2023-03-15 NOTE — Telephone Encounter (Signed)
Noted I have ordered US renal and US thyroid.

## 2023-03-15 NOTE — Addendum Note (Signed)
Addended by: Burnard Hawthorne on: 03/15/2023 09:54 PM   Modules accepted: Orders

## 2023-03-16 ENCOUNTER — Ambulatory Visit: Payer: BC Managed Care – PPO

## 2023-03-26 ENCOUNTER — Other Ambulatory Visit: Payer: Self-pay | Admitting: Family

## 2023-03-26 DIAGNOSIS — F411 Generalized anxiety disorder: Secondary | ICD-10-CM

## 2023-03-29 ENCOUNTER — Other Ambulatory Visit: Payer: Self-pay | Admitting: Family

## 2023-03-29 ENCOUNTER — Telehealth: Payer: Self-pay | Admitting: Family

## 2023-03-29 ENCOUNTER — Ambulatory Visit
Admission: RE | Admit: 2023-03-29 | Discharge: 2023-03-29 | Disposition: A | Discharge status: Home / Self Care | Payer: BC Managed Care – PPO | Source: Ambulatory Visit | Attending: Family | Admitting: Family

## 2023-03-29 ENCOUNTER — Encounter: Payer: Self-pay | Admitting: Family

## 2023-03-29 DIAGNOSIS — E041 Nontoxic single thyroid nodule: Secondary | ICD-10-CM | POA: Insufficient documentation

## 2023-03-29 DIAGNOSIS — R9389 Abnormal findings on diagnostic imaging of other specified body structures: Secondary | ICD-10-CM

## 2023-03-29 DIAGNOSIS — N2889 Other specified disorders of kidney and ureter: Secondary | ICD-10-CM | POA: Insufficient documentation

## 2023-03-29 DIAGNOSIS — N289 Disorder of kidney and ureter, unspecified: Secondary | ICD-10-CM | POA: Diagnosis not present

## 2023-03-29 DIAGNOSIS — E042 Nontoxic multinodular goiter: Secondary | ICD-10-CM | POA: Diagnosis not present

## 2023-03-29 MED ORDER — ALPRAZOLAM 0.5 MG PO TABS
0.5000 mg | ORAL_TABLET | Freq: Once | ORAL | 0 refills | Status: AC
Start: 2023-03-29 — End: 2023-03-29

## 2023-03-29 NOTE — Telephone Encounter (Signed)
I called and spoke with patient in regards to thyroid ultrasound reveals left thyroid nodule requiring fine-needle biopsy Discussed renal ultrasound showing left renal mass which requires further evaluation to exclude malignancy.  I ordered MRI abdomen, referred her to general surgery for thyroid biopsy.  She will let me know if any further

## 2023-03-29 NOTE — Assessment & Plan Note (Signed)
US Thyroid 02/2023 Nodule 1 (TI-RADS 4), measuring 4.0 x 2.1 x 2.7 cm, located in the inferior left thyroid lobe, meets criteria for FNA.

## 2023-04-03 ENCOUNTER — Ambulatory Visit
Admission: RE | Admit: 2023-04-03 | Discharge: 2023-04-03 | Disposition: A | Discharge status: Home / Self Care | Payer: BC Managed Care – PPO | Source: Ambulatory Visit | Attending: Family | Admitting: Family

## 2023-04-03 DIAGNOSIS — K760 Fatty (change of) liver, not elsewhere classified: Secondary | ICD-10-CM | POA: Diagnosis not present

## 2023-04-03 DIAGNOSIS — N2889 Other specified disorders of kidney and ureter: Secondary | ICD-10-CM | POA: Diagnosis not present

## 2023-04-03 DIAGNOSIS — N281 Cyst of kidney, acquired: Secondary | ICD-10-CM | POA: Diagnosis not present

## 2023-04-03 MED ORDER — GADOBUTROL 1 MMOL/ML IV SOLN
8.0000 mL | Freq: Once | INTRAVENOUS | Status: AC | PRN
  Administered 2023-04-03: 8 mL via INTRAVENOUS

## 2023-04-04 ENCOUNTER — Telehealth: Payer: Self-pay | Admitting: Family

## 2023-04-04 NOTE — Telephone Encounter (Signed)
Spoke to pt and informed her that when provider results MRI she will result it and I  reach out to her

## 2023-04-04 NOTE — Telephone Encounter (Signed)
Pt would like to be called regarding results she saw on mychart

## 2023-04-04 NOTE — Telephone Encounter (Signed)
LVM to call back to office  

## 2023-04-06 ENCOUNTER — Encounter: Payer: Self-pay | Admitting: Internal Medicine

## 2023-04-06 ENCOUNTER — Ambulatory Visit (INDEPENDENT_AMBULATORY_CARE_PROVIDER_SITE_OTHER): Payer: BC Managed Care – PPO | Admitting: Internal Medicine

## 2023-04-06 VITALS — BP 132/80 | HR 96 | Temp 98.1°F | Ht 62.0 in | Wt 190.0 lb

## 2023-04-06 DIAGNOSIS — G4719 Other hypersomnia: Secondary | ICD-10-CM

## 2023-04-06 NOTE — Telephone Encounter (Signed)
F/u   Patient is asking for a follow up call on MRI results from provider.   Patient is aware of previous messages from CMA on  04/04/23

## 2023-04-06 NOTE — Progress Notes (Signed)
Name: NIRVI BOEHLER MRN: [id number] DOB: [birth year]    CHIEF COMPLAINT:  EXCESSIVE DAYTIME SLEEPINESS   HISTORY OF PRESENT ILLNESS: Patient is seen today for problems and issues with sleep related to excessive daytime sleepiness Patient  has been having sleep problems for many years Patient has been having excessive daytime sleepiness for a long time Patient has been having extreme fatigue and tiredness, lack of energy  Patient has difficult work scheduled and stays awake past 2 Am, on the computer till all day till 12AM  At 530 AM, husbands alarm clock wakes her up  Patient  has tried many meds(melatonin, trazodone and Ambien) None of them work   No evidence of heart failure at this time No evidence or signs of infection at this time No respiratory distress No fevers, chills, nausea, vomiting, diarrhea No evidence of lower extremity edema No evidence hemoptysis   EPWORTH SLEEP SCORE 0    PAST MEDICAL HISTORY :   has a past medical history of Anxiety, COVID-19, Diabetes mellitus without complication, and Migraines.  has a past surgical history that includes Carpal tunnel release and Colonoscopy with propofol (N/A, 02/09/2023). Prior to Admission medications   Medication Sig Start Date End Date Taking? Authorizing Provider  DULoxetine (CYMBALTA) 30 MG capsule Take 1 capsule (30 mg total) by mouth daily. 02/15/23   Allegra Grana, FNP  meloxicam (MOBIC) 7.5 MG tablet TAKE 1 TABLET(7.5 MG) BY MOUTH DAILY AS NEEDED FOR PAIN 01/11/23   Allegra Grana, FNP  Semaglutide-Weight Management (WEGOVY) 0.25 MG/0.5ML SOAJ Inject 0.25 mg into the skin once a week. 02/15/23   Allegra Grana, FNP  traZODone (DESYREL) 100 MG tablet Take 1 tablet (100 mg total) by mouth at bedtime. 02/15/23   Allegra Grana, FNP   No Known Allergies  FAMILY HISTORY:  family history includes Hypertension in her mother, paternal grandfather, and paternal grandmother; Thyroid disease in her  sister. SOCIAL HISTORY:  reports that she quit smoking about 10 years ago. Her smoking use included cigarettes. She has never used smokeless tobacco. She reports that she does not drink alcohol and does not use drugs.   Review of Systems:  Gen:  Denies  fever, sweats, chills weight loss  HEENT: Denies blurred vision, double vision, ear pain, eye pain, hearing loss, nose bleeds, sore throat Cardiac:  No dizziness, chest pain or heaviness, chest tightness,edema, No JVD Resp:   No cough, -sputum production, -shortness of breath,-wheezing, -hemoptysis,  Gi: Denies swallowing difficulty, stomach pain, nausea or vomiting, diarrhea, constipation, bowel incontinence Gu:  Denies bladder incontinence, burning urine Ext:   Denies Joint pain, stiffness or swelling Skin: Denies  skin rash, easy bruising or bleeding or hives Endoc:  Denies polyuria, polydipsia , polyphagia or weight change Psych:   Denies depression, insomnia or hallucinations  Other:  All other systems negative   ALL OTHER ROS ARE NEGATIVE  BP 132/80 (BP Location: Left Arm, Cuff Size: Normal)   Pulse 96   Temp 98.1 F (36.7 C) (Temporal)   Ht  (1.575 m)   Wt 190 lb (86.2 kg)   SpO2 96%   BMI 34.75 kg/m     Physical Examination:   General Appearance: No distress  EYES PERRLA, EOM intact.   NECK Supple, No JVD Pulmonary: normal breath sounds, No wheezing.  CardiovascularNormal S1,S2.  No m/r/g.   Abdomen: Benign, Soft, non-tender. Skin:   warm, no rashes, no ecchymosis  Extremities: normal, no cyanosis, clubbing. Neuro:without focal findings,  speech normal  PSYCHIATRIC: Mood, affect within normal limits.   ALL OTHER ROS ARE NEGATIVE    ASSESSMENT AND PLAN SYNOPSIS  Sleep Hygiene HIGHLY RECOMMEND CHANGE IN LIFESTYLE TO ADJUST WORK TIME TO STOP AT 6PM, START EXERCISING 6-7PM,   DINNER AND FAMILY TIME 7-930PM, AND GET READY FOR BED AT 10PM.   Obesity -recommend significant weight loss -recommend  changing diet  Deconditioned state -Recommend increased daily activity and exercise   CURRENT MEDICATIONS REVIEWED AT LENGTH WITH PATIENT TODAY  Patient  satisfied with Plan of action and management. All questions answered  Follow up  3 months  Total Time Spent  35 mins   Wallis Bamberg Santiago Glad, M.D.  [facility]  Medical Director [facility] Medical Director [facility] Cardio-Pulmonary Department

## 2023-04-06 NOTE — Patient Instructions (Addendum)
HIGHLY RECOMMEND CHANGE IN LIFESTYLE TO ADJUST WORK TIME TO STOP AT 6PM, START EXERCISING 6-7PM,   DINNER AND FAMILY TIME 7-930PM, AND GET READY FOR BED AT 10PM.

## 2023-04-07 ENCOUNTER — Encounter: Payer: Self-pay | Admitting: Family

## 2023-04-07 DIAGNOSIS — K760 Fatty (change of) liver, not elsewhere classified: Secondary | ICD-10-CM | POA: Insufficient documentation

## 2023-04-12 NOTE — Telephone Encounter (Signed)
Note Jenate spoke with pt 04/07/23  Spoke to pt and went over her results pt had no questions and she verbalized understanding

## 2023-04-17 ENCOUNTER — Other Ambulatory Visit: Payer: Self-pay | Admitting: Family

## 2023-04-17 DIAGNOSIS — M25561 Pain in right knee: Secondary | ICD-10-CM

## 2023-04-17 DIAGNOSIS — M545 Low back pain, unspecified: Secondary | ICD-10-CM

## 2023-05-03 ENCOUNTER — Other Ambulatory Visit: Payer: Self-pay | Admitting: Surgery

## 2023-05-03 DIAGNOSIS — E041 Nontoxic single thyroid nodule: Secondary | ICD-10-CM

## 2023-05-23 ENCOUNTER — Other Ambulatory Visit (HOSPITAL_COMMUNITY)
Admission: RE | Admit: 2023-05-23 | Discharge: 2023-05-23 | Disposition: A | Discharge status: Home / Self Care | Payer: BC Managed Care – PPO | Source: Ambulatory Visit | Attending: Surgery | Admitting: Surgery

## 2023-05-23 ENCOUNTER — Ambulatory Visit
Admission: RE | Admit: 2023-05-23 | Discharge: 2023-05-23 | Disposition: A | Discharge status: Home / Self Care | Payer: BC Managed Care – PPO | Source: Ambulatory Visit | Attending: Surgery | Admitting: Surgery

## 2023-05-23 DIAGNOSIS — R896 Abnormal cytological findings in specimens from other organs, systems and tissues: Secondary | ICD-10-CM | POA: Insufficient documentation

## 2023-05-23 DIAGNOSIS — E041 Nontoxic single thyroid nodule: Secondary | ICD-10-CM

## 2023-05-23 DIAGNOSIS — E0789 Other specified disorders of thyroid: Secondary | ICD-10-CM | POA: Diagnosis not present

## 2023-05-25 LAB — CYTOLOGY - NON PAP

## 2023-05-25 NOTE — Progress Notes (Signed)
FNA biopsy shows atypia.  Will be sent for molecular genetic testing with [facility] - will take about three weeks to get results.  Will notify patient when results are available.  Darnell Level, MD [facility] practice Office: [phone]

## 2023-05-27 DIAGNOSIS — E041 Nontoxic single thyroid nodule: Secondary | ICD-10-CM | POA: Diagnosis not present

## 2023-05-30 ENCOUNTER — Ambulatory Visit (INDEPENDENT_AMBULATORY_CARE_PROVIDER_SITE_OTHER): Payer: BC Managed Care – PPO

## 2023-05-30 DIAGNOSIS — E538 Deficiency of other specified B group vitamins: Secondary | ICD-10-CM

## 2023-05-30 MED ORDER — CYANOCOBALAMIN 1000 MCG/ML IJ SOLN
1000.0000 ug | Freq: Once | INTRAMUSCULAR | Status: AC
Start: 2023-05-30 — End: 2023-05-30
  Administered 2023-05-30: 1000 ug via INTRAMUSCULAR

## 2023-05-30 NOTE — Progress Notes (Signed)
After obtaining consent, and per orders of Bethanie Dicker, NP, injection of B-12 given IM in left deltoid by Valentino Nose. Patient tolerated injection well.

## 2023-06-11 IMAGING — DX DG KNEE COMPLETE 4+V*R*
5 series · 5 of 5 positions shown · non-contrast
Comparison: None.

CLINICAL DATA: Right knee pain.

EXAM:
RIGHT KNEE - COMPLETE 4+ VIEW

[knee standing ap]
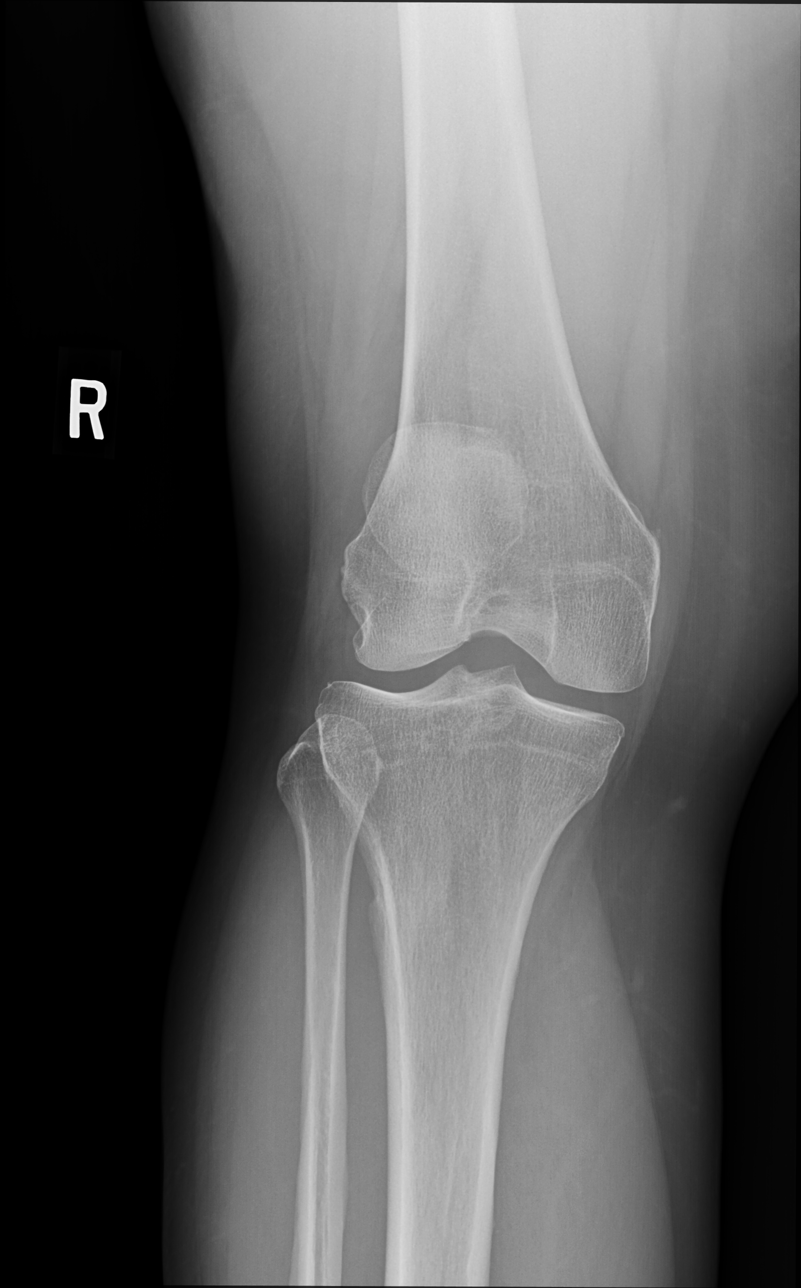

[knee standing external ap]
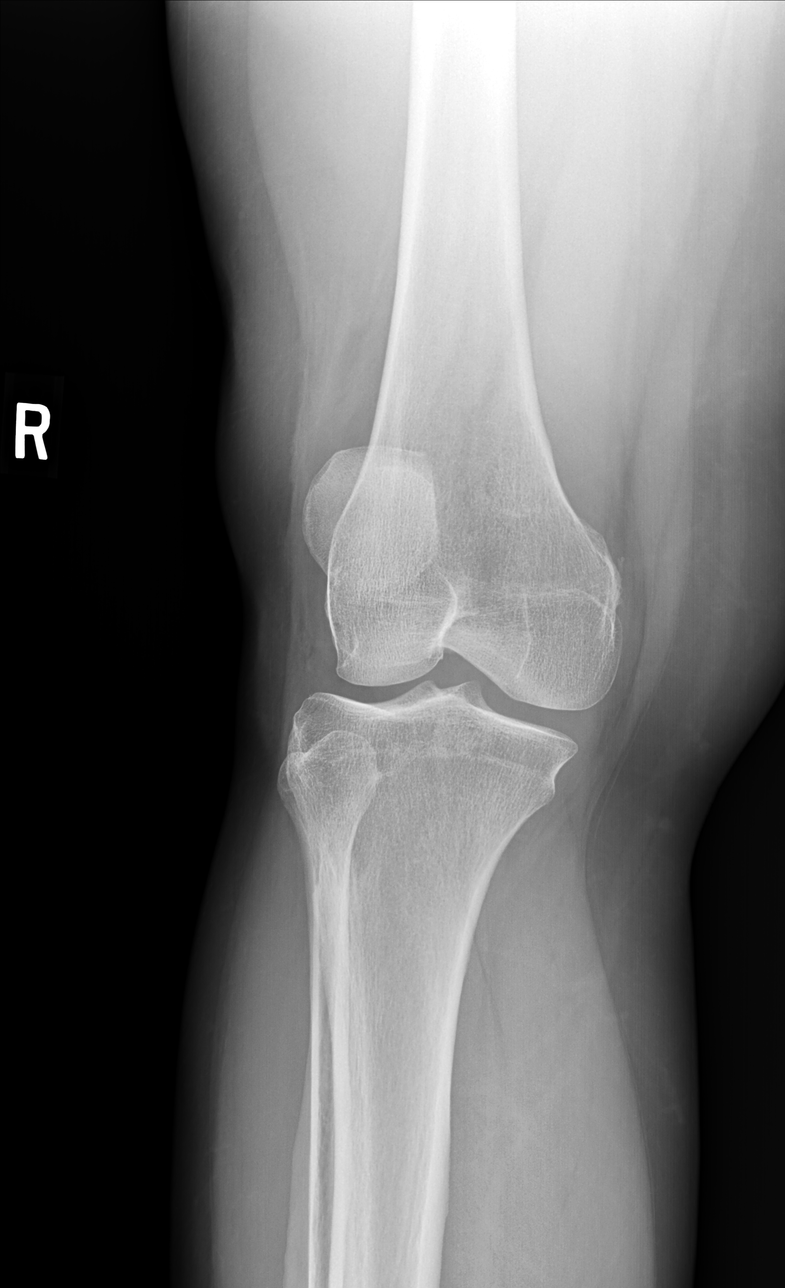

[knee standing internal ap]
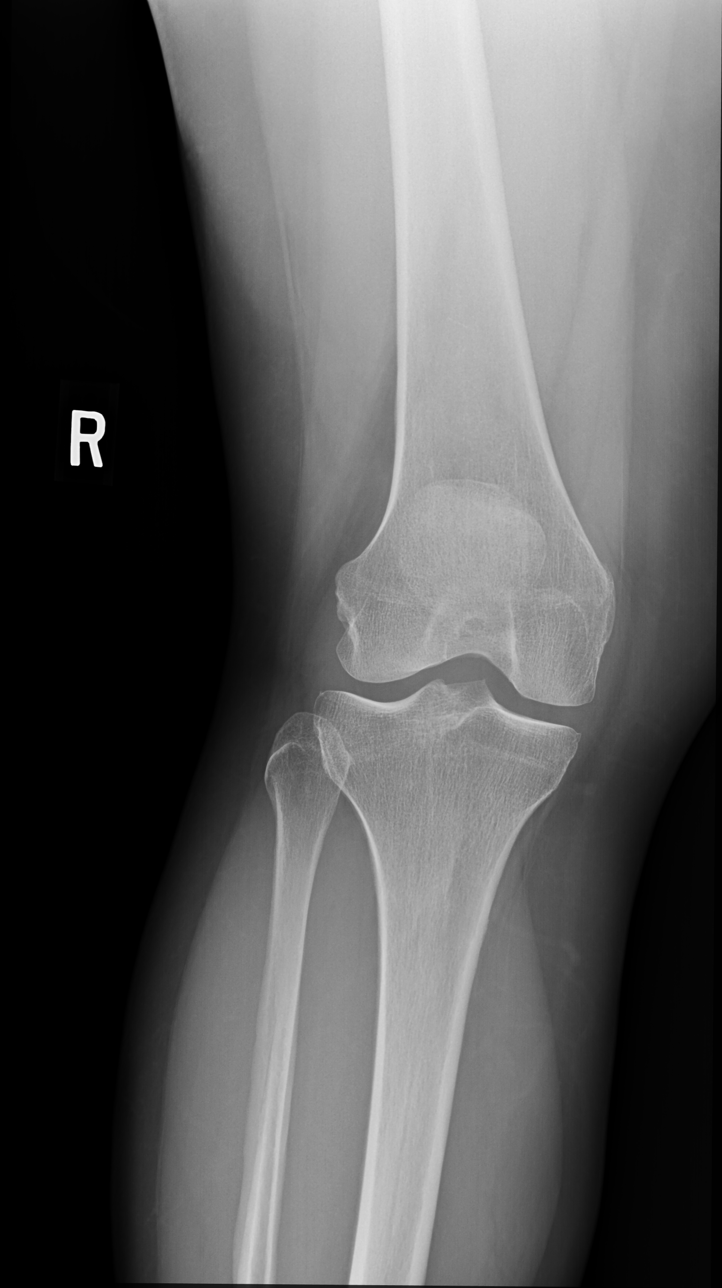

[knee standing lat]
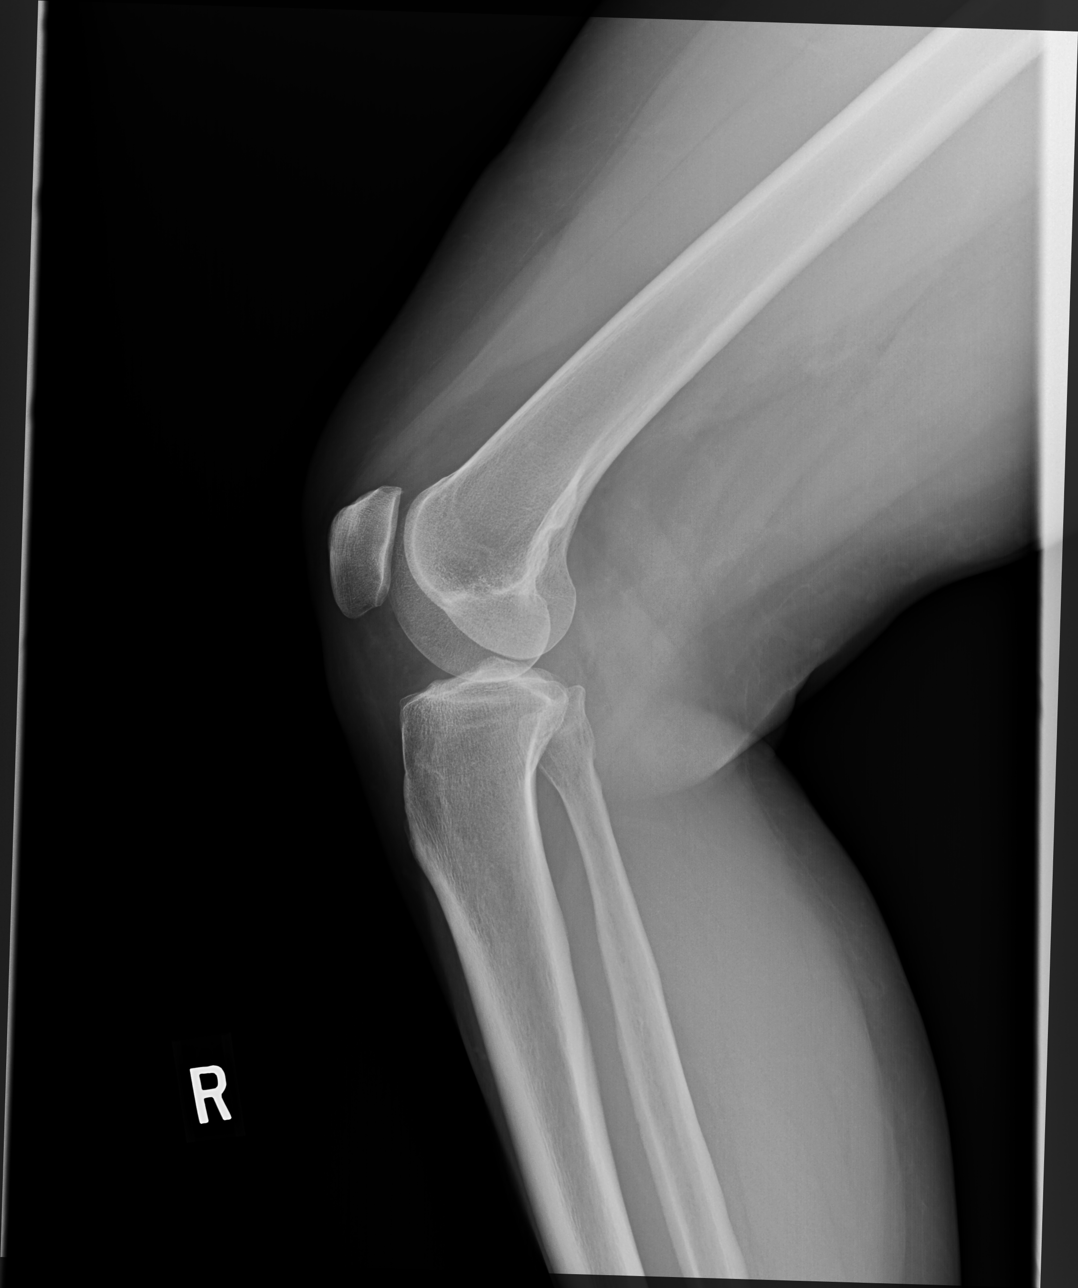

[knee [person_name] view pa]
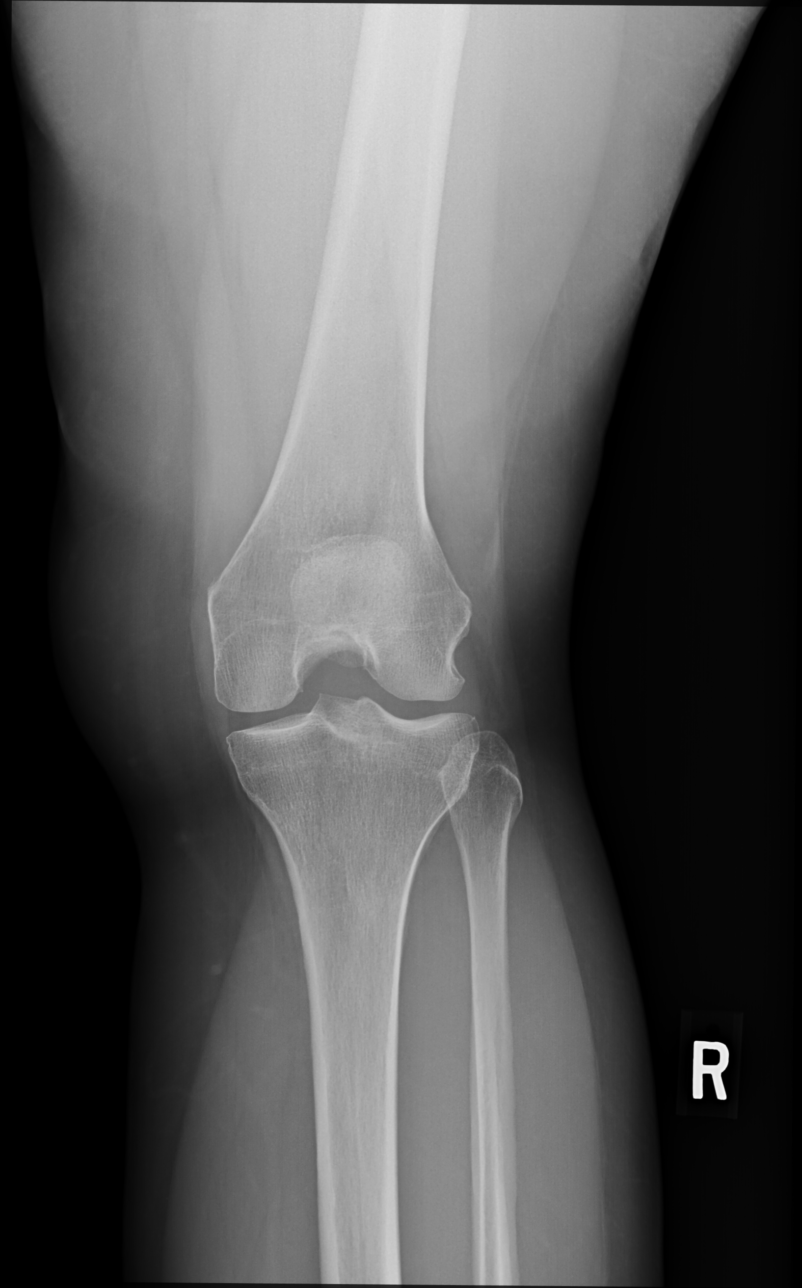

[5 of 5 positions shown; findings below may reference images not displayed]

FINDINGS: The joint spaces are preserved. Trace patellofemoral and medial
tibiofemoral peripheral spurring. No fracture, erosion, bony
destruction or focal bone abnormality. There is a small knee joint
effusion.
IMPRESSION: Trace osteoarthritis with small joint effusion.

## 2023-06-12 ENCOUNTER — Encounter (HOSPITAL_COMMUNITY): Payer: Self-pay

## 2023-06-14 NOTE — Progress Notes (Signed)
AFIRMA results are benign.  Good news.  Will see in office in one year for exam with a repeat USN and TSH level.  Darnell Level, MD [facility] practice Office: [phone]

## 2023-06-29 ENCOUNTER — Ambulatory Visit (INDEPENDENT_AMBULATORY_CARE_PROVIDER_SITE_OTHER): Payer: BC Managed Care – PPO

## 2023-06-29 DIAGNOSIS — E538 Deficiency of other specified B group vitamins: Secondary | ICD-10-CM

## 2023-06-29 MED ORDER — CYANOCOBALAMIN 1000 MCG/ML IJ SOLN
1000.0000 ug | Freq: Once | INTRAMUSCULAR | Status: AC
Start: 2023-06-29 — End: 2023-06-29
  Administered 2023-06-29: 1000 ug via INTRAMUSCULAR

## 2023-06-29 NOTE — Progress Notes (Signed)
After obtaining consent, and per orders of  Kacy Lester, NP, injection of B12 given IM in right deltoid by Makela Niehoff Lynn. Patient tolerated injection well.   

## 2023-07-09 ENCOUNTER — Other Ambulatory Visit: Payer: Self-pay | Admitting: Family

## 2023-07-09 DIAGNOSIS — M545 Low back pain, unspecified: Secondary | ICD-10-CM

## 2023-07-09 DIAGNOSIS — M25561 Pain in right knee: Secondary | ICD-10-CM

## 2023-07-18 ENCOUNTER — Ambulatory Visit: Payer: BC Managed Care – PPO | Admitting: Internal Medicine

## 2023-07-21 ENCOUNTER — Encounter: Payer: Self-pay | Admitting: Internal Medicine

## 2023-07-31 ENCOUNTER — Ambulatory Visit (INDEPENDENT_AMBULATORY_CARE_PROVIDER_SITE_OTHER): Payer: BC Managed Care – PPO

## 2023-07-31 ENCOUNTER — Other Ambulatory Visit: Payer: Self-pay

## 2023-07-31 ENCOUNTER — Encounter: Payer: Self-pay | Admitting: Family

## 2023-07-31 DIAGNOSIS — E538 Deficiency of other specified B group vitamins: Secondary | ICD-10-CM | POA: Diagnosis not present

## 2023-07-31 MED ORDER — CYANOCOBALAMIN 1000 MCG/ML IJ SOLN
1000.0000 ug | Freq: Once | INTRAMUSCULAR | Status: AC
Start: 2023-07-31 — End: 2023-07-31
  Administered 2023-07-31: 1000 ug via INTRAMUSCULAR

## 2023-07-31 NOTE — Progress Notes (Signed)
Pt presented for their vitamin B12 injection. Pt was identified through two identifiers. Pt tolerated shot well in their right deltoid.  

## 2023-08-02 ENCOUNTER — Other Ambulatory Visit (HOSPITAL_COMMUNITY): Payer: Self-pay

## 2023-08-02 NOTE — Telephone Encounter (Signed)
Pharmacy Patient Advocate Encounter   Received notification from Patient Advice Request messages that prior authorization for [facility] is required/requested.   Insurance verification completed.   The patient is insured through  [employer]  .   Per test claim, medication is not covered, member must pay full cash price

## 2023-08-03 ENCOUNTER — Encounter (INDEPENDENT_AMBULATORY_CARE_PROVIDER_SITE_OTHER): Payer: Self-pay

## 2023-08-04 ENCOUNTER — Ambulatory Visit
Admission: RE | Admit: 2023-08-04 | Discharge: 2023-08-04 | Disposition: A | Discharge status: Home / Self Care | Payer: BC Managed Care – PPO | Source: Ambulatory Visit | Attending: Urgent Care | Admitting: Urgent Care

## 2023-08-04 VITALS — BP 131/86 | HR 99 | Temp 97.7°F | Resp 18

## 2023-08-04 DIAGNOSIS — M7542 Impingement syndrome of left shoulder: Secondary | ICD-10-CM

## 2023-08-04 MED ORDER — PREDNISONE 10 MG (21) PO TBPK
ORAL_TABLET | Freq: Every day | ORAL | 0 refills | Status: DC
Start: 2023-08-04 — End: 2023-09-26

## 2023-08-04 NOTE — Discharge Instructions (Addendum)
Follow-up with an evaluation by an orthopedic evaluation if your symptoms do not improve with treatment.  I have provided contact information for [facility], a local orthopedic provider.  Alternatively, you may ask your PCP for a referral to a different orthopedic provider.

## 2023-08-04 NOTE — ED Provider Notes (Signed)
[name]    CSN: [id number] Arrival date & time: 08/04/23  1339      History   Chief Complaint Chief Complaint  Patient presents with   Shoulder Pain    Developed it and progressively has gotten worse to where I can't raise my arm without extreme pain. I can only think I slept wrong but just don't know - Entered by patient    HPI Jodi Hawkins is a 52 y.o. female.    Shoulder Pain  Presents urgent care with complaint of left shoulder pain x 3 days.  Patient states she was sleeping on her arm.  Pain is progressively worsened and she has difficulty raising her arm.  Past Medical History:  Diagnosis Date   Anxiety    COVID-19    09/2020   Diabetes mellitus without complication (HCC)    Migraines     Patient Active Problem List   Diagnosis Date Noted   Hepatic steatosis 04/07/2023   Thyroid nodule 03/29/2023   Left renal mass 03/29/2023   Adenomatous polyp of colon 02/09/2023   Fatigue 01/11/2023   B12 deficiency 08/11/2022   BMI 35.0-35.9,adult 08/10/2022   Chronic pain of right knee 08/10/2022   Low back pain 08/10/2022   Urinary incontinence 08/10/2022   Hyperlipidemia 08/10/2022   Prediabetes 08/10/2022   Headache 01/15/2020   Sacral pain 01/15/2020   Dizziness 09/12/2017   Acute dysfunction of left eustachian tube 11/01/2016   OME (otitis media with effusion) 11/22/2015   Vitamin D deficiency 11/22/2015   Annual physical exam 10/23/2015   Generalized anxiety disorder 09/11/2015   Right knee pain 09/11/2015   Insomnia 09/11/2015    Past Surgical History:  Procedure Laterality Date   CARPAL TUNNEL RELEASE     COLONOSCOPY WITH PROPOFOL N/A 02/09/2023   Procedure: COLONOSCOPY WITH PROPOFOL;  Surgeon: Wyline Mood, MD;  Location: [facility] ENDOSCOPY;  Service: Gastroenterology;  Laterality: N/A;    OB History   No obstetric history on file.      Home Medications    Prior to Admission medications   Medication Sig Start Date End Date  Taking? Authorizing Provider  DULoxetine (CYMBALTA) 30 MG capsule Take 1 capsule (30 mg total) by mouth daily. 02/15/23   Allegra Grana, FNP  meloxicam (MOBIC) 7.5 MG tablet TAKE 1 TABLET(7.5 MG) BY MOUTH DAILY AS NEEDED FOR PAIN 07/10/23   Allegra Grana, FNP  Semaglutide-Weight Management (WEGOVY) 0.25 MG/0.5ML SOAJ Inject 0.25 mg into the skin once a week. 02/15/23   Allegra Grana, FNP  traZODone (DESYREL) 100 MG tablet Take 1 tablet (100 mg total) by mouth at bedtime. 02/15/23   Allegra Grana, FNP    Family History Family History  Problem Relation Age of Onset   Hypertension Mother    Thyroid disease Sister    Hypertension Paternal Grandmother    Hypertension Paternal Grandfather    Breast cancer Neg Hx    Thyroid cancer Neg Hx     Social History Social History   Tobacco Use   Smoking status: Former    Current packs/day: 0.00    Average packs/day: 1 pack/day for 31.0 years (31.0 ttl pk-yrs)    Types: Cigarettes    Start date: 06/18/1981    Quit date: 06/18/2012    Years since quitting: 11.1   Smokeless tobacco: Never  Vaping Use   Vaping status: Never Used  Substance Use Topics   Alcohol use: No    Alcohol/week: 0.0 standard drinks of  alcohol   Drug use: No     Allergies   Patient has no known allergies.   Review of Systems Review of Systems   Physical Exam Triage Vital Signs ED Triage Vitals  Encounter Vitals Group     BP 08/04/23 1407 131/86     Systolic BP Percentile --      Diastolic BP Percentile --      Pulse Rate 08/04/23 1407 99     Resp 08/04/23 1407 18     Temp 08/04/23 1407 97.7 F (36.5 C)     Temp Source 08/04/23 1407 Temporal     SpO2 08/04/23 1407 97 %     Weight --      Height --      Head Circumference --      Peak Flow --      Pain Score 08/04/23 1406 7     Pain Loc --      Pain Education --      Exclude from Growth Chart --    No data found.  Updated Vital Signs BP 131/86 (BP Location: Left Arm)   Pulse 99    Temp 97.7 F (36.5 C) (Temporal)   Resp 18   SpO2 97%   Visual Acuity Right Eye Distance:   Left Eye Distance:   Bilateral Distance:    Right Eye Near:   Left Eye Near:    Bilateral Near:     Physical Exam   UC Treatments / Results  Labs (all labs ordered are listed, but only abnormal results are displayed) Labs Reviewed - No data to display  EKG   Radiology No results found.  Procedures Procedures (including critical care time)  Medications Ordered in UC Medications - No data to display  Initial Impression / Assessment and Plan / UC Course  I have reviewed the triage vital signs and the nursing notes.  Pertinent labs & imaging results that were available during my care of the patient were reviewed by me and considered in my medical decision making (see chart for details).   Jodi Hawkins is a 52 y.o. female presenting with L shoulder pain. Patient is afebrile without recent antipyretics, satting well on room air. Overall is well appearing, well hydrated, without respiratory distress.  Reduced range of motion and left shoulder.  Radicular Pain with movement.  Pain with resisted movement.  Reviewed relevant chart history.   Presumed left shoulder impingement due to inflammatory disorder.  Given the acute nature of her symptoms, will treat with anti-inflammatory corticosteroid and recommend follow-up with orthopedic evaluation if her symptoms do not improve.  Counseled patient on potential for adverse effects with medications prescribed/recommended today, ER and return-to-clinic precautions discussed, patient verbalized understanding and agreement with care plan.  Final Clinical Impressions(s) / UC Diagnoses   Final diagnoses:  None   Discharge Instructions   None    ED Prescriptions   None    PDMP not reviewed this encounter.   Charma Igo, [place] 08/04/23 1425

## 2023-08-04 NOTE — ED Triage Notes (Signed)
Patient presents to UC for left shoulder pain from sleeping on arm x 3 days. Treating pain with ibuprofen.   Denies injury.

## 2023-08-31 ENCOUNTER — Ambulatory Visit: Payer: BC Managed Care – PPO

## 2023-09-01 ENCOUNTER — Ambulatory Visit (INDEPENDENT_AMBULATORY_CARE_PROVIDER_SITE_OTHER): Payer: BC Managed Care – PPO

## 2023-09-01 DIAGNOSIS — E538 Deficiency of other specified B group vitamins: Secondary | ICD-10-CM | POA: Diagnosis not present

## 2023-09-01 MED ORDER — CYANOCOBALAMIN 1000 MCG/ML IJ SOLN
1000.0000 ug | Freq: Once | INTRAMUSCULAR | Status: AC
Start: 2023-09-01 — End: 2023-09-01
  Administered 2023-09-01: 1000 ug via INTRAMUSCULAR

## 2023-09-01 NOTE — Progress Notes (Signed)
Pt stated that the last time she got b12 injection in her left arm here that she ended up in the ED because she had a infringed nerve her arm was in so much pain that she could not move her arm . Per her chart the last b12 she received was in her right arm and not the left.  07/31/2023 10:45 AM  Author: Donavan Foil, CMA Author Type: Certified Medical Assistant Filed: 07/31/2023 10:47 AM  Note Status: Attested Cosign: Cosigned by Glori Luis, MD at 08/01/2023 12:13 PM Encounter Date: 07/31/2023  Editor: Donavan Foil, CMA (Certified Medical Assistant)                Attestation signed by Glori Luis, MD at 08/01/2023 12:13 PM   I have reviewed the above note and agree.   Marikay Alar, M.D.          Pt presented for their vitamin B12 injection. Pt was identified through two identifiers. Pt tolerated shot well in their right deltoid.         Patient presented for B 12 injection to left deltoid, 09/01/23 patient voiced no concerns nor showed any signs of distress during injection

## 2023-09-26 ENCOUNTER — Ambulatory Visit: Payer: BC Managed Care – PPO | Admitting: Nurse Practitioner

## 2023-09-26 ENCOUNTER — Encounter: Payer: Self-pay | Admitting: Nurse Practitioner

## 2023-09-26 VITALS — BP 128/80 | HR 110 | Temp 97.9°F | Ht 62.0 in | Wt 191.4 lb

## 2023-09-26 DIAGNOSIS — R5383 Other fatigue: Secondary | ICD-10-CM

## 2023-09-26 DIAGNOSIS — Z6835 Body mass index (BMI) 35.0-35.9, adult: Secondary | ICD-10-CM

## 2023-09-26 NOTE — Patient Instructions (Signed)
Glad you are feeling better  Follow-up with [place] as needed if symptoms return or worsen.

## 2023-09-26 NOTE — Assessment & Plan Note (Addendum)
Significant improvement with B12 injections and correcting sleep habits.  She is feeling better.  Will hold off on further testing at this point.  Encouraged her to remain active and continue working on sleep hygiene.  She is prescribed trazodone by her PCP.  Healthy weight loss encouraged.  She will follow-up with [place] as needed.  Patient Instructions  Glad you are feeling better  Follow-up with [place] as needed if symptoms return or worsen.

## 2023-09-26 NOTE — Progress Notes (Signed)
@Patient  ID: Jodi Hawkins, female    DOB: 10-18-1971, 52 y.o.   MRN: [id number]  Chief Complaint  Patient presents with   Follow-up    Sleeping an average of 6-7 hours with trazodone. Daytime sleepiness has improved with monthly B-12 injection.    Referring provider: Allegra Grana, FNP  HPI: 52 year old female, former smoker followed for excessive daytime sleepiness. She is a patient of Dr. Clovis Fredrickson and last seen in office 04/06/2023. Past medical history significant for thyroid nodule, anxiety, HLD, prediabetes. Former smoker followed by lung cancer screening program.   TEST/EVENTS:   04/06/2023: OV with Dr. Belia Heman for sleep consult.  Has excessive daytime sleepiness.  Has had trouble with her sleep for many years.  Has a difficult work schedule and stays awake past 2 AM some days.  Difficult on the computer all day until 12 AM.  At 5:30 AM, husband's alarm clock usually wakes her up.  She tried many medications including melatonin, trazodone and Ambien.  Recommend weight loss and diet change.  Increase activity and exercise.  Suspect that symptoms are primarily related to poor sleep hygiene.  Recommended adjusting work time to stop at 6 PM and get ready to bed by 10 PM.  09/26/2023: Today-follow-up Patient presents today for follow-up.  Since she was here last, she started B12 injections.  She is also been working on weight loss measures with [facility].  She continues to take trazodone nightly for sleep.  She seems to be doing better and is not feeling as groggy during the day.  Feels like her energy levels have improved.  Seems to be sleeping more restfully at night.  Trying to have more of a consistent schedule.  Denies any issues with drowsy driving, morning headaches, sleep parasomnia/paralysis.  No Known Allergies  Immunization History  Administered Date(s) Administered   Influenza Inj Mdck Quad Pf 10/09/2018   Influenza,inj,Quad PF,6+ Mos 11/01/2016, 09/12/2017    Influenza-Unspecified 09/18/2020    Past Medical History:  Diagnosis Date   Anxiety    COVID-19    09/2020   Diabetes mellitus without complication (HCC)    Migraines     Tobacco History: Social History   Tobacco Use  Smoking Status Former   Current packs/day: 0.00   Average packs/day: 1 pack/day for 31.0 years (31.0 ttl pk-yrs)   Types: Cigarettes   Start date: 06/18/1981   Quit date: 06/18/2012   Years since quitting: 11.2  Smokeless Tobacco Never   Counseling given: Not Answered   Outpatient Medications Prior to Visit  Medication Sig Dispense Refill   DULoxetine (CYMBALTA) 30 MG capsule Take 1 capsule (30 mg total) by mouth daily. 90 capsule 3   meloxicam (MOBIC) 7.5 MG tablet TAKE 1 TABLET(7.5 MG) BY MOUTH DAILY AS NEEDED FOR PAIN 90 tablet 1   Semaglutide-Weight Management (WEGOVY) 0.25 MG/0.5ML SOAJ Inject 0.25 mg into the skin once a week. 2 mL 1   traZODone (DESYREL) 100 MG tablet Take 1 tablet (100 mg total) by mouth at bedtime. 90 tablet 3   predniSONE (STERAPRED UNI-PAK 21 TAB) 10 MG (21) TBPK tablet Take by mouth daily. Take 6 tabs by mouth daily for 1 day, then 5 tabs for 1 day, then 4 tabs for 1 day, then 3 tabs for 1 day, then 2 tabs for 1 day, then 1 tab by mouth daily for 1 day 21 tablet 0   No facility-administered medications prior to visit.     Review of Systems:  Constitutional: No weight loss or gain, night sweats, fevers, chills, or lassitude. +fatigue (improved) HEENT: No headaches, difficulty swallowing, tooth/dental problems, or sore throat. No sneezing, itching, ear ache, nasal congestion, or post nasal drip CV:  No chest pain, orthopnea, PND, swelling in lower extremities, anasarca, dizziness, palpitations, syncope Resp: No shortness of breath with exertion or at rest. No excess mucus or change in color of mucus. No productive or non-productive. No hemoptysis. No wheezing.  No chest wall deformity GI:  No heartburn, indigestion GU: No  nocturia Neuro: No dizziness or lightheadedness.  Psych: No depression or anxiety. Mood stable.     Physical Exam:  BP 128/80 (BP Location: Left Arm, Patient Position: Sitting, Cuff Size: Normal)   Pulse (!) 110   Temp 97.9 F (36.6 C) (Temporal)   Ht 5\' 2"  (1.575 m)   Wt 191 lb 6.4 oz (86.8 kg)   SpO2 96%   BMI 35.01 kg/m   GEN: Pleasant, interactive, well-appearing; obese; in no acute distress. HEENT:  Normocephalic and atraumatic. PERRLA. Sclera white. Nasal turbinates pink, moist and patent bilaterally. No rhinorrhea present. Oropharynx pink and moist, without exudate or edema. No lesions, ulcerations, or postnasal drip.  NECK:  Supple w/ fair ROM. Thyroid symmetrical with no goiter or nodules palpated. No lymphadenopathy.   CV: RRR, no m/r/g, no peripheral edema. Pulses intact, +2 bilaterally. No cyanosis, pallor or clubbing. PULMONARY:  Unlabored, regular breathing. Clear bilaterally A&P w/o wheezes/rales/rhonchi. No accessory muscle use.  GI: BS present and normoactive. Soft, non-tender to palpation. No organomegaly or masses detected.  MSK: No erythema, warmth or tenderness. Cap refil <2 sec all extrem.  Neuro: A/Ox3. No focal deficits noted.   Skin: Warm, no lesions or rashe Psych: Normal affect and behavior. Judgement and thought content appropriate.     Lab Results:  CBC    Component Value Date/Time   WBC 5.9 01/11/2023 1412   RBC 4.79 01/11/2023 1412   HGB 14.7 01/11/2023 1412   HCT 42.4 01/11/2023 1412   PLT 187.0 01/11/2023 1412   MCV 88.5 01/11/2023 1412   MCH 29.6 01/05/2020 1243   MCHC 34.6 01/11/2023 1412   RDW 13.4 01/11/2023 1412   LYMPHSABS 2.4 01/11/2023 1412   MONOABS 0.4 01/11/2023 1412   EOSABS 0.0 01/11/2023 1412   BASOSABS 0.1 01/11/2023 1412    BMET    Component Value Date/Time   NA 139 01/21/2022 1052   K 4.0 01/21/2022 1052   CL 104 01/21/2022 1052   CO2 28 01/21/2022 1052   GLUCOSE 121 (H) 01/21/2022 1052   BUN 13 01/21/2022  1052   CREATININE 0.81 01/21/2022 1052   CALCIUM 9.8 01/21/2022 1052   GFRNONAA >60 01/05/2020 1243   GFRAA >60 01/05/2020 1243    BNP No results found for: "BNP"   Imaging:  No results found.  cyanocobalamin (VITAMIN B12) injection 1,000 mcg     Date Action Dose Route User   Discharged on 08/04/2023   Admitted on 08/04/2023   07/31/2023 1041 Given 1,000 mcg Intramuscular (Right Deltoid) Kristie Cowman, CMA      cyanocobalamin (VITAMIN B12) injection 1,000 mcg     Date Action Dose Route User   09/01/2023 1452 Given 1,000 mcg Intramuscular (Left Deltoid) [place], Jenate, CMA           No data to display          No results found for: "NITRICOXIDE"      Assessment & Plan:   Fatigue Significant improvement with  B12 injections and correcting sleep habits.  She is feeling better.  Will hold off on further testing at this point.  Encouraged her to remain active and continue working on sleep hygiene.  She is prescribed trazodone by her PCP.  Healthy weight loss encouraged.  She will follow-up with [place] as needed.  Patient Instructions  Glad you are feeling better  Follow-up with [place] as needed if symptoms return or worsen.   BMI 35.0-35.9,adult BMI 35. Healthy weight loss encouraged   I spent 21 minutes of dedicated to the care of this patient on the date of this encounter to include pre-visit review of records, face-to-face time with the patient discussing conditions above, post visit ordering of testing, clinical documentation with the electronic health record, making appropriate referrals as documented, and communicating necessary findings to members of the patients care team.  Noemi Chapel, NP 09/26/2023  Pt aware and understands NP's role.

## 2023-09-26 NOTE — Assessment & Plan Note (Signed)
BMI 35. Healthy weight loss encouraged.  

## 2023-10-02 ENCOUNTER — Ambulatory Visit: Payer: BC Managed Care – PPO

## 2023-10-08 ENCOUNTER — Encounter: Payer: Self-pay | Admitting: Family

## 2023-10-08 DIAGNOSIS — F411 Generalized anxiety disorder: Secondary | ICD-10-CM

## 2023-10-08 DIAGNOSIS — G4700 Insomnia, unspecified: Secondary | ICD-10-CM

## 2023-10-08 DIAGNOSIS — M25561 Pain in right knee: Secondary | ICD-10-CM

## 2023-10-08 DIAGNOSIS — M545 Low back pain, unspecified: Secondary | ICD-10-CM

## 2023-10-09 MED ORDER — TRAZODONE HCL 100 MG PO TABS: 100.0000 mg | ORAL_TABLET | Freq: Every day | ORAL | 0 refills | Status: DC

## 2023-10-09 MED ORDER — MELOXICAM 7.5 MG PO TABS
ORAL_TABLET | ORAL | 0 refills | Status: DC
Start: 2023-10-09 — End: 2024-01-12

## 2023-10-09 NOTE — Telephone Encounter (Signed)
Refill pended for your approval for a 7 day supply of each to the [facility] in [place]

## 2023-10-30 ENCOUNTER — Ambulatory Visit (INDEPENDENT_AMBULATORY_CARE_PROVIDER_SITE_OTHER): Payer: BC Managed Care – PPO

## 2023-10-30 DIAGNOSIS — E538 Deficiency of other specified B group vitamins: Secondary | ICD-10-CM | POA: Diagnosis not present

## 2023-10-30 MED ORDER — CYANOCOBALAMIN 1000 MCG/ML IJ SOLN
1000.0000 ug | Freq: Once | INTRAMUSCULAR | Status: AC
  Administered 2023-10-30: 1000 ug via INTRAMUSCULAR

## 2023-10-30 NOTE — Progress Notes (Signed)
Pt presented for their vitamin B12 injection. Pt was identified through two identifiers. Pt tolerated shot well in their left  deltoid.  

## 2023-11-28 ENCOUNTER — Other Ambulatory Visit: Payer: Self-pay | Admitting: Family

## 2023-11-28 DIAGNOSIS — N2889 Other specified disorders of kidney and ureter: Secondary | ICD-10-CM

## 2023-11-28 DIAGNOSIS — E041 Nontoxic single thyroid nodule: Secondary | ICD-10-CM

## 2023-11-29 ENCOUNTER — Ambulatory Visit: Payer: BC Managed Care – PPO

## 2023-11-29 DIAGNOSIS — E538 Deficiency of other specified B group vitamins: Secondary | ICD-10-CM | POA: Diagnosis not present

## 2023-11-29 MED ORDER — CYANOCOBALAMIN 1000 MCG/ML IJ SOLN
1000.0000 ug | Freq: Once | INTRAMUSCULAR | Status: AC
  Administered 2023-11-29: 1000 ug via INTRAMUSCULAR

## 2023-11-29 NOTE — Progress Notes (Signed)
Patient presented for B 12 injection to right deltoid, patient voiced no concerns nor showed any signs of distress during injection. 

## 2023-12-25 ENCOUNTER — Telehealth: Payer: BC Managed Care – PPO | Admitting: Nurse Practitioner

## 2023-12-25 DIAGNOSIS — H60502 Unspecified acute noninfective otitis externa, left ear: Secondary | ICD-10-CM

## 2023-12-25 MED ORDER — CIPROFLOXACIN-DEXAMETHASONE 0.3-0.1 % OT SUSP: 4.0000 [drp] | Freq: Two times a day (BID) | OTIC | 0 refills | Status: AC

## 2023-12-25 MED ORDER — FLUTICASONE PROPIONATE 50 MCG/ACT NA SUSP: 2.0000 | Freq: Every day | NASAL | 6 refills | Status: DC

## 2023-12-25 NOTE — Progress Notes (Signed)
 Virtual Visit Consent   Jodi Hawkins, you are scheduled for a virtual visit with a [facility] provider today. Just as with appointments in the office, your consent must be obtained to participate. Your consent will be active for this visit and any virtual visit you may have with one of our providers in the next 365 days. If you have a MyChart account, a copy of this consent can be sent to you electronically.  As this is a virtual visit, video technology does not allow for your provider to perform a traditional examination. This may limit your provider's ability to fully assess your condition. If your provider identifies any concerns that need to be evaluated in person or the need to arrange testing (such as labs, EKG, etc.), we will make arrangements to do so. Although advances in technology are sophisticated, we cannot ensure that it will always work on either your end or our end. If the connection with a video visit is poor, the visit may have to be switched to a telephone visit. With either a video or telephone visit, we are not always able to ensure that we have a secure connection.  By engaging in this virtual visit, you consent to the provision of healthcare and authorize for your insurance to be billed (if applicable) for the services provided during this visit. Depending on your insurance coverage, you may receive a charge related to this service.  I need to obtain your verbal consent now. Are you willing to proceed with your visit today? Jodi Hawkins has provided verbal consent on 12/25/2023 for a virtual visit (video or telephone). Lauraine Kitty, FNP  Date: 12/25/2023 4:15 PM  Virtual Visit via Video Note   I, Lauraine Kitty, connected with  Jodi Hawkins  ([id number], [birth year]) on 12/25/23 at  4:30 PM EST by a video-enabled telemedicine application and verified that I am speaking with the correct person using two identifiers.  Location: Patient: Virtual Visit Location Patient:  Home Provider: Virtual Visit Location Provider: Home Office   I discussed the limitations of evaluation and management by telemedicine and the availability of in person appointments. The patient expressed understanding and agreed to proceed.    History of Present Illness: Jodi Hawkins is a 53 y.o. who identifies as a female who was assigned female at birth, and is being seen today for left ear pain that has been associated with a headache   This has kept her from sleeping for the past two nights  She feels at night that her ear is throbbing and she feels like she has water in her ear   She has not been congested, denies sinus pressure but has suffered from sinusitis in the past since moving to [place]   Denies a fever  Denies a history of ear infections   Symptoms are worse when laying down She feels dry skin inside her left ear  Denies a history of recurrent ear infections   She has been using ibuprofen for pain relief   She has had a migraine in the past but feels this is different    Problems:  Patient Active Problem List   Diagnosis Date Noted   Hepatic steatosis 04/07/2023   Thyroid  nodule 03/29/2023   Left renal mass 03/29/2023   Adenomatous polyp of colon 02/09/2023   Fatigue 01/11/2023   B12 deficiency 08/11/2022   BMI 35.0-35.9,adult 08/10/2022   Chronic pain of right knee 08/10/2022   Low back pain 08/10/2022   Urinary  incontinence 08/10/2022   Hyperlipidemia 08/10/2022   Prediabetes 08/10/2022   Headache 01/15/2020   Sacral pain 01/15/2020   Dizziness 09/12/2017   Acute dysfunction of left eustachian tube 11/01/2016   OME (otitis media with effusion) 11/22/2015   Vitamin D  deficiency 11/22/2015   Annual physical exam 10/23/2015   Generalized anxiety disorder 09/11/2015   Right knee pain 09/11/2015   Insomnia 09/11/2015    Allergies: No Known Allergies Medications:  Current Outpatient Medications:    DULoxetine  (CYMBALTA ) 30 MG capsule, Take 1 capsule  (30 mg total) by mouth daily., Disp: 90 capsule, Rfl: 3   meloxicam  (MOBIC ) 7.5 MG tablet, TAKE 1 TABLET(7.5 MG) BY MOUTH DAILY AS NEEDED FOR PAIN, Disp: 7 tablet, Rfl: 0   Semaglutide -Weight Management (WEGOVY ) 0.25 MG/0.5ML SOAJ, Inject 0.25 mg into the skin once a week., Disp: 2 mL, Rfl: 1   traZODone  (DESYREL ) 100 MG tablet, Take 1 tablet (100 mg total) by mouth at bedtime., Disp: 7 tablet, Rfl: 0  Observations/Objective: Patient is well-developed, well-nourished in no acute distress.  Resting comfortably  at home.  Head is normocephalic, atraumatic.  No labored breathing.  Speech is clear and coherent with logical content.  Patient is alert and oriented at baseline.    Assessment and Plan:  1. Acute otitis externa of left ear, unspecified type (Primary) Advised 600mg  ibuprofen for added pain relief every 6-8 hours as needed  Discussed with patient if symptoms do not improve in the next 48 hours or with new/worsening symptoms she should be seen in person for evaluation   - ciprofloxacin -dexamethasone  (CIPRODEX ) OTIC suspension; Place 4 drops into the left ear 2 (two) times daily for 7 days.  Dispense: 7.5 mL; Refill: 0 - fluticasone  (FLONASE ) 50 MCG/ACT nasal spray; Place 2 sprays into both nostrils daily.  Dispense: 16 g; Refill: 6     Follow Up Instructions: I discussed the assessment and treatment plan with the patient. The patient was provided an opportunity to ask questions and all were answered. The patient agreed with the plan and demonstrated an understanding of the instructions.  A copy of instructions were sent to the patient via MyChart unless otherwise noted below.    The patient was advised to call back or seek an in-person evaluation if the symptoms worsen or if the condition fails to improve as anticipated.    Lauraine Kitty, FNP

## 2023-12-27 ENCOUNTER — Ambulatory Visit: Payer: BC Managed Care – PPO

## 2023-12-27 DIAGNOSIS — E538 Deficiency of other specified B group vitamins: Secondary | ICD-10-CM

## 2023-12-27 MED ORDER — CYANOCOBALAMIN 1000 MCG/ML IJ SOLN
1000.0000 ug | Freq: Once | INTRAMUSCULAR | Status: AC
  Administered 2023-12-27: 1000 ug via INTRAMUSCULAR

## 2023-12-27 NOTE — Progress Notes (Signed)
 Patient presented for B 12 injection to right deltoid, patient voiced no concerns nor showed any signs of distress during injection.

## 2024-01-12 ENCOUNTER — Other Ambulatory Visit: Payer: Self-pay | Admitting: Family

## 2024-01-12 DIAGNOSIS — M25561 Pain in right knee: Secondary | ICD-10-CM

## 2024-01-12 DIAGNOSIS — G8929 Other chronic pain: Secondary | ICD-10-CM

## 2024-01-29 ENCOUNTER — Ambulatory Visit (INDEPENDENT_AMBULATORY_CARE_PROVIDER_SITE_OTHER): Payer: BC Managed Care – PPO

## 2024-01-29 DIAGNOSIS — E538 Deficiency of other specified B group vitamins: Secondary | ICD-10-CM

## 2024-01-29 MED ORDER — CYANOCOBALAMIN 1000 MCG/ML IJ SOLN
1000.0000 ug | Freq: Once | INTRAMUSCULAR | Status: AC
  Administered 2024-01-29: 1000 ug via INTRAMUSCULAR

## 2024-01-29 NOTE — Progress Notes (Signed)
After obtaining consent, and per orders of  Margaret Arnett, NP, injection of B-12 given IM in left deltoid by Dravyn Severs Lynn. Patient tolerated injection well.   

## 2024-02-12 ENCOUNTER — Other Ambulatory Visit: Payer: Self-pay | Admitting: Family

## 2024-02-12 DIAGNOSIS — G4700 Insomnia, unspecified: Secondary | ICD-10-CM

## 2024-02-12 DIAGNOSIS — F411 Generalized anxiety disorder: Secondary | ICD-10-CM

## 2024-02-14 ENCOUNTER — Other Ambulatory Visit: Payer: Self-pay | Admitting: Family

## 2024-02-14 DIAGNOSIS — F411 Generalized anxiety disorder: Secondary | ICD-10-CM

## 2024-02-14 DIAGNOSIS — G4700 Insomnia, unspecified: Secondary | ICD-10-CM

## 2024-02-14 NOTE — Telephone Encounter (Signed)
 Copied from CRM [id number]. Topic: Clinical - Medication Refill >> Feb 14, 2024 11:40 AM Truddie Crumble wrote: Most Recent Primary Care Visit:  Provider: Valentino Nose  Department: [facility]  Visit Type: NURSE VISIT  Date: 01/29/2024  Medication: traZODone  Has the patient contacted their pharmacy? Yes (Agent: If no, request that the patient contact the pharmacy for the refill. If patient does not wish to contact the pharmacy document the reason why and proceed with request.) (Agent: If yes, when and what did the pharmacy advise?)  Is this the correct pharmacy for this prescription? Yes If no, delete pharmacy and type the correct one.  This is the patient's preferred pharmacy:  [id number] [place], [place] - [address] AT [facility] [address] [place] [address] Phone: [phone] Fax: [phone]  Has the prescription been filled recently? No  Is the patient out of the medication? Yes  Has the patient been seen for an appointment in the last year OR does the patient have an upcoming appointment? Yes  Can we respond through MyChart? Yes  Agent: Please be advised that Rx refills may take up to 3 business days. We ask that you follow-up with your pharmacy.

## 2024-02-14 NOTE — Telephone Encounter (Unsigned)
 Copied from CRM [id number]. Topic: Clinical - Medication Refill >> Feb 14, 2024 11:40 AM Truddie Crumble wrote: Most Recent Primary Care Visit:  Provider: Valentino Nose  Department: [facility]  Visit Type: NURSE VISIT  Date: 01/29/2024  Medication: traZODone  Has the patient contacted their pharmacy? Yes (Agent: If no, request that the patient contact the pharmacy for the refill. If patient does not wish to contact the pharmacy document the reason why and proceed with request.) (Agent: If yes, when and what did the pharmacy advise?)  Is this the correct pharmacy for this prescription? Yes If no, delete pharmacy and type the correct one.  This is the patient's preferred pharmacy:  [id number] [place], [place] - [address] AT [facility] [place] [address] Phone: [phone] Fax: [phone]  Has the prescription been filled recently? No  Is the patient out of the medication? Yes  Has the patient been seen for an appointment in the last year OR does the patient have an upcoming appointment? Yes  Can we respond through MyChart? Yes  Agent: Please be advised that Rx refills may take up to 3 business days. We ask that you follow-up with your pharmacy.

## 2024-02-26 ENCOUNTER — Ambulatory Visit (INDEPENDENT_AMBULATORY_CARE_PROVIDER_SITE_OTHER): Payer: BC Managed Care – PPO

## 2024-02-26 DIAGNOSIS — E538 Deficiency of other specified B group vitamins: Secondary | ICD-10-CM

## 2024-02-26 MED ORDER — CYANOCOBALAMIN 1000 MCG/ML IJ SOLN
1000.0000 ug | Freq: Once | INTRAMUSCULAR | Status: AC
  Administered 2024-02-26: 1000 ug via INTRAMUSCULAR

## 2024-02-26 NOTE — Progress Notes (Signed)
 After obtaining consent, and per orders of Rennie Plowman, FNP injection of B-12 given IM in R Deltoid by Malen Gauze, CMA. Patient tolerated well injection.

## 2024-03-01 DIAGNOSIS — L309 Dermatitis, unspecified: Secondary | ICD-10-CM | POA: Diagnosis not present

## 2024-03-01 DIAGNOSIS — L821 Other seborrheic keratosis: Secondary | ICD-10-CM | POA: Diagnosis not present

## 2024-03-01 DIAGNOSIS — L814 Other melanin hyperpigmentation: Secondary | ICD-10-CM | POA: Diagnosis not present

## 2024-03-08 ENCOUNTER — Ambulatory Visit
Admission: RE | Admit: 2024-03-08 | Discharge: 2024-03-08 | Disposition: A | Discharge status: Home / Self Care | Source: Ambulatory Visit | Attending: Family | Admitting: Family

## 2024-03-08 DIAGNOSIS — Z122 Encounter for screening for malignant neoplasm of respiratory organs: Secondary | ICD-10-CM | POA: Diagnosis not present

## 2024-03-08 DIAGNOSIS — Z87891 Personal history of nicotine dependence: Secondary | ICD-10-CM | POA: Insufficient documentation

## 2024-03-11 ENCOUNTER — Other Ambulatory Visit: Payer: Self-pay | Admitting: Family

## 2024-03-11 DIAGNOSIS — G8929 Other chronic pain: Secondary | ICD-10-CM

## 2024-03-11 DIAGNOSIS — M25561 Pain in right knee: Secondary | ICD-10-CM

## 2024-03-12 ENCOUNTER — Other Ambulatory Visit: Payer: Self-pay | Admitting: Family

## 2024-03-12 DIAGNOSIS — F411 Generalized anxiety disorder: Secondary | ICD-10-CM

## 2024-03-12 DIAGNOSIS — M545 Low back pain, unspecified: Secondary | ICD-10-CM

## 2024-03-14 ENCOUNTER — Other Ambulatory Visit (HOSPITAL_COMMUNITY)
Admission: RE | Admit: 2024-03-14 | Discharge: 2024-03-14 | Disposition: A | Discharge status: Home / Self Care | Source: Ambulatory Visit | Attending: Family | Admitting: Family

## 2024-03-14 ENCOUNTER — Ambulatory Visit (INDEPENDENT_AMBULATORY_CARE_PROVIDER_SITE_OTHER): Payer: BC Managed Care – PPO | Admitting: Family

## 2024-03-14 ENCOUNTER — Encounter: Payer: Self-pay | Admitting: Family

## 2024-03-14 VITALS — BP 126/78 | HR 98 | Temp 98.5°F | Ht 62.0 in | Wt 193.0 lb

## 2024-03-14 DIAGNOSIS — Z13220 Encounter for screening for lipoid disorders: Secondary | ICD-10-CM

## 2024-03-14 DIAGNOSIS — Z6835 Body mass index (BMI) 35.0-35.9, adult: Secondary | ICD-10-CM | POA: Diagnosis not present

## 2024-03-14 DIAGNOSIS — Z0000 Encounter for general adult medical examination without abnormal findings: Secondary | ICD-10-CM | POA: Diagnosis not present

## 2024-03-14 DIAGNOSIS — E041 Nontoxic single thyroid nodule: Secondary | ICD-10-CM

## 2024-03-14 DIAGNOSIS — K760 Fatty (change of) liver, not elsewhere classified: Secondary | ICD-10-CM | POA: Diagnosis not present

## 2024-03-14 DIAGNOSIS — Z1231 Encounter for screening mammogram for malignant neoplasm of breast: Secondary | ICD-10-CM | POA: Diagnosis not present

## 2024-03-14 DIAGNOSIS — Z136 Encounter for screening for cardiovascular disorders: Secondary | ICD-10-CM | POA: Diagnosis not present

## 2024-03-14 DIAGNOSIS — N289 Disorder of kidney and ureter, unspecified: Secondary | ICD-10-CM

## 2024-03-14 LAB — COMPREHENSIVE METABOLIC PANEL WITH GFR
ALT: 18 U/L (ref 0–35)
AST: 14 U/L (ref 0–37)
Albumin: 4.3 g/dL (ref 3.5–5.2)
Alkaline Phosphatase: 77 U/L (ref 39–117)
BUN: 13 mg/dL (ref 6–23)
CO2: 30 meq/L (ref 19–32)
Calcium: 9.8 mg/dL (ref 8.4–10.5)
Chloride: 102 meq/L (ref 96–112)
Creatinine, Ser: 0.83 mg/dL (ref 0.40–1.20)
GFR: 79.66 mL/min (ref >60.00)
Glucose, Bld: 118 mg/dL — ABNORMAL HIGH (ref 70–99)
Potassium: 4.3 meq/L (ref 3.5–5.1)
Sodium: 141 meq/L (ref 135–145)
Total Bilirubin: 0.3 mg/dL (ref 0.2–1.2)
Total Protein: 6.9 g/dL (ref 6.0–8.3)

## 2024-03-14 LAB — CBC WITH DIFFERENTIAL/PLATELET
Basophils Absolute: 0.1 10*3/uL (ref 0.0–0.1)
Basophils Relative: 0.9 % (ref 0.0–3.0)
Eosinophils Absolute: 0 10*3/uL (ref 0.0–0.7)
Eosinophils Relative: 0.2 % (ref 0.0–5.0)
HCT: 44.6 % (ref 36.0–46.0)
Hemoglobin: 15 g/dL (ref 12.0–15.0)
Lymphocytes Relative: 31.7 % (ref 12.0–46.0)
Lymphs Abs: 2.2 10*3/uL (ref 0.7–4.0)
MCHC: 33.7 g/dL (ref 30.0–36.0)
MCV: 90.6 fl (ref 78.0–100.0)
Monocytes Absolute: 0.5 10*3/uL (ref 0.1–1.0)
Monocytes Relative: 6.6 % (ref 3.0–12.0)
Neutro Abs: 4.3 10*3/uL (ref 1.4–7.7)
Neutrophils Relative %: 60.6 % (ref 43.0–77.0)
Platelets: 201 10*3/uL (ref 150.0–400.0)
RBC: 4.93 Mil/uL (ref 3.87–5.11)
RDW: 13.4 % (ref 11.5–15.5)
WBC: 7 10*3/uL (ref 4.0–10.5)

## 2024-03-14 LAB — LIPID PANEL
Cholesterol: 229 mg/dL — ABNORMAL HIGH (ref 0–200)
HDL: 38.1 mg/dL — ABNORMAL LOW (ref >39.00)
LDL Cholesterol: 147 mg/dL — ABNORMAL HIGH (ref 0–99)
NonHDL: 191.34
Total CHOL/HDL Ratio: 6
Triglycerides: 223 mg/dL — ABNORMAL HIGH (ref 0.0–149.0)
VLDL: 44.6 mg/dL — ABNORMAL HIGH (ref 0.0–40.0)

## 2024-03-14 MED ORDER — WEGOVY 0.25 MG/0.5ML ~~LOC~~ SOAJ: 0.2500 mg | SUBCUTANEOUS | 2 refills | Status: DC

## 2024-03-14 NOTE — Progress Notes (Signed)
 Assessment & Plan:  Annual physical exam Assessment & Plan: CBE and pap smear obtained today.  Reviewed annual CT chest and incidental findings today,  lung RADS 1, negative.  Heart rate returned to normal range as patient rested in exam room.  Counseled on caffeine use.  Pending labs.   Orders: -     Cytology - PAP -     VITAMIN D 25 Hydroxy (Vit-D Deficiency, Fractures) -     Hemoglobin A1c -     CBC with Differential/Platelet -     Comprehensive metabolic panel with GFR -     Lipid panel -     TSH -     Wegovy; Inject 0.25 mg into the skin once a week.  Dispense: 2 mL; Refill: 2 -     3D Screening Mammogram, Left and Right; Future -     Protime-INR  Hepatic steatosis Assessment & Plan: Counseled on lifestyle modification to thwart progression.  Pending labs.   Orders: -     CBC with Differential/Platelet -     Comprehensive metabolic panel with GFR -     Protime-INR  BMI 35.0-35.9,adult Assessment & Plan: I have re prescribed Wegovy to see if covered by new carrier.  Counseled on blackbox warning, side effects and titration.  Congratulated patient on diligence to exercise  Orders: -     ZOXWRU; Inject 0.25 mg into the skin once a week.  Dispense: 2 mL; Refill: 2  Encounter for screening mammogram for malignant neoplasm of breast -     3D Screening Mammogram, Left and Right; Future  Encounter for lipid screening for cardiovascular disease -     Lipid panel  Thyroid nodule Assessment & Plan: History of left thyroid nodule4.0 x 2.1 x 2.7 cm , thyroid biopsy, Dr. Gerrit Friends 05/2023 FNA biopsy shows atypia.  Will be sent for molecular genetic testing with [facility] . Patient reports normal AFFIRMA testing ( unable to see report in Epic)  She is due for follow-up Dr. Gerrit Friends and repeat thyroid ultrasound.  Discussed this today after review of CT chest.  She will call Dr. Ardine Eng office   Renal lesion     Return precautions given.   Risks, benefits, and alternatives of  the medications and treatment plan prescribed today were discussed, and patient expressed understanding.   Education regarding symptom management and diagnosis given to patient on AVS either electronically or printed.  No follow-ups on file.  Rennie Plowman, FNP  Subjective:    Patient ID: Jodi Hawkins, female    DOB: 1971-08-30, 53 y.o.   MRN: [id number]  CC: Jodi Hawkins is a 53 y.o. female who presents today for physical exam.    HPI: Overall feels well today.  No new concerns.  Insurance did not cover [employer].  She is interested in trying to prescribe again as she has new insurance.     Denies CP, palpitations, SOB.   [place] which causes anxiety  She had 12 oz caffeine today prior to coming in.   She has established with [facility]  Family history of thyroid disease History of left thyroid nodule4.0 x 2.1 x 2.7 cm , thyroid biopsy, Dr. Gerrit Friends 05/2023 FNA biopsy shows atypia.  Will be sent for molecular genetic testing with [facility]   Colorectal Cancer Screening: UTD 02/09/23 , Dr Tobi Bastos, repeat in 5 years Breast Cancer Screening: Mammogram due Cervical Cancer Screening: btain 02/15/2023, atrophy, negative malignancy, negative HP Bone Health screening/DEXA for 65+:  No increased fracture risk. Defer screening at this time.  Lung Cancer Screening: obtained 03/08/24  Lung-RADS 1, negative. Continue annual screening with low-dose chest CT without contrast in 12 months. 2. Left-sided thyroid nodule of 2.2 cm. Recommend thyroid US (ref: J Am Coll Radiol. 2015 Feb;12(2): 143-50). 3. Subcentimeter left renal lesion, too small to characterize. Recommend attention on follow-up. 4.  Emphysema (ICD10-J43.9).        Tetanus - due; declines        Exercise: Gets regular exercise, walking 3 days per week.   Alcohol use:  none Smoking/tobacco use: former smoker.    Health Maintenance  Topic Date Due   Pap Smear  02/16/2024   Screening for Lung Cancer   03/07/2024   Flu Shot  03/18/2024*   DTaP/Tdap/Td vaccine (1 - Tdap) 03/14/2025*   Mammogram  02/01/2025   Colon Cancer Screening  02/10/2028   Hepatitis C Screening  Completed   HIV Screening  Completed   HPV Vaccine  Aged Out   COVID-19 Vaccine  Discontinued   Zoster (Shingles) Vaccine  Discontinued  *Topic was postponed. The date shown is not the original due date.    ALLERGIES: Patient has no known allergies.  Current Outpatient Medications on File Prior to Visit  Medication Sig Dispense Refill   DULoxetine (CYMBALTA) 30 MG capsule TAKE 1 CAPSULE BY MOUTH EVERY DAY 90 capsule 1   fluticasone (FLONASE) 50 MCG/ACT nasal spray Place 2 sprays into both nostrils daily. 16 g 6   meloxicam (MOBIC) 7.5 MG tablet TAKE 1 TABLET(7.5 MG) BY MOUTH DAILY AS NEEDED FOR PAIN 30 tablet 1   traZODone (DESYREL) 100 MG tablet TAKE 1 TABLET BY MOUTH EVERYDAY AT BEDTIME 90 tablet 3   No current facility-administered medications on file prior to visit.    Review of Systems  Constitutional:  Negative for chills, fever and unexpected weight change.  HENT:  Negative for congestion.   Respiratory:  Negative for cough.   Cardiovascular:  Negative for chest pain, palpitations and leg swelling.  Gastrointestinal:  Negative for nausea and vomiting.  Musculoskeletal:  Negative for arthralgias and myalgias.  Skin:  Negative for rash.  Neurological:  Negative for headaches.  Hematological:  Negative for adenopathy.  Psychiatric/Behavioral:  Negative for confusion.       Objective:    BP 126/78   Pulse 98   Temp 98.5 F (36.9 C) (Oral)   Ht 5\' 2"  (1.575 m)   Wt 193 lb (87.5 kg)   SpO2 97%   BMI 35.30 kg/m   BP Readings from Last 3 Encounters:  03/14/24 126/78  09/26/23 128/80  08/04/23 131/86   Wt Readings from Last 3 Encounters:  03/14/24 193 lb (87.5 kg)  09/26/23 191 lb 6.4 oz (86.8 kg)  04/06/23 190 lb (86.2 kg)    Physical Exam Vitals reviewed.  Constitutional:      Appearance:  Normal appearance. She is well-developed.  Eyes:     Conjunctiva/sclera: Conjunctivae normal.  Neck:     Thyroid: No thyroid mass or thyromegaly.  Cardiovascular:     Rate and Rhythm: Normal rate and regular rhythm.     Pulses: Normal pulses.     Heart sounds: Normal heart sounds.  Pulmonary:     Effort: Pulmonary effort is normal.     Breath sounds: Normal breath sounds. No wheezing, rhonchi or rales.  Chest:  Breasts:    Breasts are symmetrical.     Right: No inverted nipple, mass, nipple discharge, skin  change or tenderness.     Left: No inverted nipple, mass, nipple discharge, skin change or tenderness.  Abdominal:     General: Bowel sounds are normal. There is no distension.     Palpations: Abdomen is soft. Abdomen is not rigid. There is no fluid wave or mass.     Tenderness: There is no abdominal tenderness. There is no guarding or rebound.  Genitourinary:    Cervix: No cervical motion tenderness, discharge or friability.     Uterus: Not enlarged, not fixed and not tender.      Adnexa:        Right: No mass, tenderness or fullness.         Left: No mass, tenderness or fullness.       Comments: Pap performed. No CMT. Unable to appreciated ovaries. Lymphadenopathy:     Head:     Right side of head: No submental, submandibular, tonsillar, preauricular, posterior auricular or occipital adenopathy.     Left side of head: No submental, submandibular, tonsillar, preauricular, posterior auricular or occipital adenopathy.     Cervical: No cervical adenopathy.     Right cervical: No superficial, deep or posterior cervical adenopathy.    Left cervical: No superficial, deep or posterior cervical adenopathy.     Upper Body:     Right upper body: No pectoral adenopathy.     Left upper body: No pectoral adenopathy.  Skin:    General: Skin is warm and dry.  Neurological:     Mental Status: She is alert.  Psychiatric:        Speech: Speech normal.        Behavior: Behavior normal.         Thought Content: Thought content normal.

## 2024-03-14 NOTE — Patient Instructions (Addendum)
 Call Dr Gerrit Friends to schedule follow up for thyroid nodule.   [facility] - [address] [place], [place] [address] [phone]  Please call  and schedule your 3D mammogram and /or bone density scan as we discussed.   [facility]  ( new location in 2023)  [address], [place], [place] [address]  [place], [place]  [phone]   We have discussed starting non insulin daily injectable medication called Reginal Lutes  which is a glucagon like peptide (GLP 1) agonist and works by delaying gastric emptying and increasing insulin secretion.It is given once per week. Most patients see significant weight loss with this drug class.   You may NOT take either medication if you or your family has history of thyroid, parathyroid, OR adrenal cancer. Please confirm you and your family does NOT have this history as this drug class has black box warning on this medication for that reason.   Please follow  directions on prescription and slowly increase from 0.25mg  [place] once per week ;stay here for 4 weeks. You may then increase to 0.5mg  [place] once per week and stay there for 4 weeks.  We can slowly titrate further at follow up with goal of no more than 1-2 lbs weight loss per week.  Semaglutide [facility])  Dose (mg) Once Weekly Titration:    If a dose is not tolerated, consider delaying further dose increases for  another 4 weeks.  If you are actively losing weight on a dose, do not increase medication.   Weeks 1 through 4  0.25 mg once weekly  Weeks 5 through 8  0.5 mg once weekly  Weeks 9 though 12  1 mg once weekly  Weeks 13 through 16  1.7 mg once weekly   Semaglutide Injection (Weight Management) What is this medication? SEMAGLUTIDE (SEM a GLOO tide) promotes weight loss. It may also be used to maintain weight loss. It works by decreasing appetite. Changes to diet and exercise are often combined with this medication. This medicine may be used  for other purposes; ask your health care provider or pharmacist if you have questions. COMMON BRAND NAME(S): LKGMWN What should I tell my care team before I take this medication? They need to know if you have any of these conditions: Endocrine tumors (MEN 2) or if someone in your family had these tumors Eye disease, vision problems Gallbladder disease History of depression or mental health disease History of pancreatitis Kidney disease Stomach or intestine problems Suicidal thoughts, plans, or attempt; a previous suicide attempt by you or a family member Thyroid cancer or if someone in your family had thyroid cancer An unusual or allergic reaction to semaglutide, other medications, foods, dyes, or preservatives Pregnant or trying to get pregnant Breast-feeding How should I use this medication? This medication is injected under the skin. You will be taught how to prepare and give it. Take it as directed on the prescription label. It is given once every week (every 7 days). Keep taking it unless your care team tells you to stop. It is important that you put your used needles and pens in a special sharps container. Do not put them in a trash can. If you do not have a sharps container, call your pharmacist or care team to get one. A special MedGuide will be given to you by the pharmacist with each prescription and refill. Be sure to read this information carefully each time. This medication comes with INSTRUCTIONS FOR USE.  Ask your pharmacist for directions on how to use this medication. Read the information carefully. Talk to your pharmacist or care team if you have questions. Talk to your care team about the use of this medication in children. While it may be prescribed for children as [place] as 12 years for selected conditions, precautions do apply. Overdosage: If you think you have taken too much of this medicine contact a poison control center or emergency room at once. NOTE: This medicine is  only for you. Do not share this medicine with others. What if I miss a dose? If you miss a dose and the next scheduled dose is more than 2 days away, take the missed dose as soon as possible. If you miss a dose and the next scheduled dose is less than 2 days away, do not take the missed dose. Take the next dose at your regular time. Do not take double or extra doses. If you miss your dose for 2 weeks or more, take the next dose at your regular time or call your care team to talk about how to restart this medication. What may interact with this medication? Insulin and other medications for diabetes This list may not describe all possible interactions. Give your health care provider a list of all the medicines, herbs, non-prescription drugs, or dietary supplements you use. Also tell them if you smoke, drink alcohol, or use illegal drugs. Some items may interact with your medicine. What should I watch for while using this medication? Visit your care team for regular checks on your progress. It may be some time before you see the benefit from this medication. Drink plenty of fluids while taking this medication. Check with your care team if you have severe diarrhea, nausea, and vomiting, or if you sweat a lot. The loss of too much body fluid may make it dangerous for you to take this medication. This medication may affect blood sugar levels. Ask your care team if changes in diet or medications are needed if you have diabetes. If you or your family notice any changes in your behavior, such as new or worsening depression, thoughts of harming yourself, anxiety, other unusual or disturbing thoughts, or memory loss, call your care team right away. Women should inform their care team if they wish to become pregnant or think they might be pregnant. Losing weight while pregnant is not advised and may cause harm to the unborn child. Talk to your care team for more information. What side effects may I notice from  receiving this medication? Side effects that you should report to your care team as soon as possible: Allergic reactions--skin rash, itching, hives, swelling of the face, lips, tongue, or throat Change in vision Dehydration--increased thirst, dry mouth, feeling faint or lightheaded, headache, dark yellow or brown urine Gallbladder problems--severe stomach pain, nausea, vomiting, fever Heart palpitations--rapid, pounding, or irregular heartbeat Kidney injury--decrease in the amount of urine, swelling of the ankles, hands, or feet Pancreatitis--severe stomach pain that spreads to your back or gets worse after eating or when touched, fever, nausea, vomiting Thoughts of suicide or self-harm, worsening mood, feelings of depression Thyroid cancer--new mass or lump in the neck, pain or trouble swallowing, trouble breathing, hoarseness Side effects that usually do not require medical attention (report to your care team if they continue or are bothersome): Diarrhea Loss of appetite Nausea Stomach pain Vomiting This list may not describe all possible side effects. Call your doctor for medical advice about side effects. You may  report side effects to FDA at 1-800-FDA-1088. Where should I keep my medication? Keep out of the reach of children and pets. Refrigeration (preferred): Store in the refrigerator. Do not freeze. Keep this medication in the original container until you are ready to take it. Get rid of any unused medication after the expiration date. Room temperature: If needed, prior to cap removal, the pen can be stored at room temperature for up to 28 days. Protect from light. If it is stored at room temperature, get rid of any unused medication after 28 days or after it expires, whichever is first. It is important to get rid of the medication as soon as you no longer need it or it is expired. You can do this in two ways: Take the medication to a medication take-back program. Check with your  pharmacy or law enforcement to find a location. If you cannot return the medication, follow the directions in the MedGuide. NOTE: This sheet is a summary. It may not cover all possible information. If you have questions about this medicine, talk to your doctor, pharmacist, or health care provider.  2023 Elsevier/Gold Standard (2021-02-18 00:00:00) Health Maintenance for Postmenopausal Women Menopause is a normal process in which your ability to get pregnant comes to an end. This process happens slowly over many months or years, usually between the ages of 60 and 43. Menopause is complete when you have missed your menstrual period for 12 months. It is important to talk with your health care provider about some of the most common conditions that affect women after menopause (postmenopausal women). These include heart disease, cancer, and bone loss (osteoporosis). Adopting a healthy lifestyle and getting preventive care can help to promote your health and wellness. The actions you take can also lower your chances of developing some of these common conditions. What are the signs and symptoms of menopause? During menopause, you may have the following symptoms: Hot flashes. These can be moderate or severe. Night sweats. Decrease in sex drive. Mood swings. Headaches. Tiredness (fatigue). Irritability. Memory problems. Problems falling asleep or staying asleep. Talk with your health care provider about treatment options for your symptoms. Do I need hormone replacement therapy? Hormone replacement therapy is effective in treating symptoms that are caused by menopause, such as hot flashes and night sweats. Hormone replacement carries certain risks, especially as you become older. If you are thinking about using estrogen or estrogen with progestin, discuss the benefits and risks with your health care provider. How can I reduce my risk for heart disease and stroke? The risk of heart disease, heart  attack, and stroke increases as you age. One of the causes may be a change in the body's hormones during menopause. This can affect how your body uses dietary fats, triglycerides, and cholesterol. Heart attack and stroke are medical emergencies. There are many things that you can do to help prevent heart disease and stroke. Watch your blood pressure High blood pressure causes heart disease and increases the risk of stroke. This is more likely to develop in people who have high blood pressure readings or are overweight. Have your blood pressure checked: Every 3-5 years if you are 58-57 years of age. Every year if you are 69 years old or older. Eat a healthy diet  Eat a diet that includes plenty of vegetables, fruits, low-fat dairy products, and lean protein. Do not eat a lot of foods that are high in solid fats, added sugars, or sodium. Get regular exercise Get regular exercise.  This is one of the most important things you can do for your health. Most adults should: Try to exercise for at least 150 minutes each week. The exercise should increase your heart rate and make you sweat (moderate-intensity exercise). Try to do strengthening exercises at least twice each week. Do these in addition to the moderate-intensity exercise. Spend less time sitting. Even light physical activity can be beneficial. Other tips Work with your health care provider to achieve or maintain a healthy weight. Do not use any products that contain nicotine or tobacco. These products include cigarettes, chewing tobacco, and vaping devices, such as e-cigarettes. If you need help quitting, ask your health care provider. Know your numbers. Ask your health care provider to check your cholesterol and your blood sugar (glucose). Continue to have your blood tested as directed by your health care provider. Do I need screening for cancer? Depending on your health history and family history, you may need to have cancer screenings at  different stages of your life. This may include screening for: Breast cancer. Cervical cancer. Lung cancer. Colorectal cancer. What is my risk for osteoporosis? After menopause, you may be at increased risk for osteoporosis. Osteoporosis is a condition in which bone destruction happens more quickly than new bone creation. To help prevent osteoporosis or the bone fractures that can happen because of osteoporosis, you may take the following actions: If you are 36-86 years old, get at least 1,000 mg of calcium and at least 600 international units (IU) of vitamin D per day. If you are older than age 36 but younger than age 60, get at least 1,200 mg of calcium and at least 600 international units (IU) of vitamin D per day. If you are older than age 4, get at least 1,200 mg of calcium and at least 800 international units (IU) of vitamin D per day. Smoking and drinking excessive alcohol increase the risk of osteoporosis. Eat foods that are rich in calcium and vitamin D, and do weight-bearing exercises several times each week as directed by your health care provider. How does menopause affect my mental health? Depression may occur at any age, but it is more common as you become older. Common symptoms of depression include: Feeling depressed. Changes in sleep patterns. Changes in appetite or eating patterns. Feeling an overall lack of motivation or enjoyment of activities that you previously enjoyed. Frequent crying spells. Talk with your health care provider if you think that you are experiencing any of these symptoms. General instructions See your health care provider for regular wellness exams and vaccines. This may include: Scheduling regular health, dental, and eye exams. Getting and maintaining your vaccines. These include: Influenza vaccine. Get this vaccine each year before the flu season begins. Pneumonia vaccine. Shingles vaccine. Tetanus, diphtheria, and pertussis (Tdap) booster  vaccine. Your health care provider may also recommend other immunizations. Tell your health care provider if you have ever been abused or do not feel safe at home. Summary Menopause is a normal process in which your ability to get pregnant comes to an end. This condition causes hot flashes, night sweats, decreased interest in sex, mood swings, headaches, or lack of sleep. Treatment for this condition may include hormone replacement therapy. Take actions to keep yourself healthy, including exercising regularly, eating a healthy diet, watching your weight, and checking your blood pressure and blood sugar levels. Get screened for cancer and depression. Make sure that you are up to date with all your vaccines. This information is not  intended to replace advice given to you by your health care provider. Make sure you discuss any questions you have with your health care provider. Document Revised: 04/26/2021 Document Reviewed: 04/26/2021 Elsevier Patient Education  2024 [employer].

## 2024-03-14 NOTE — Assessment & Plan Note (Addendum)
 CBE and pap smear obtained today.  Reviewed annual CT chest and incidental findings today,  lung RADS 1, negative.  Heart rate returned to normal range as patient rested in exam room.  Counseled on caffeine use.  Pending labs.

## 2024-03-14 NOTE — Assessment & Plan Note (Addendum)
 History of left thyroid nodule4.0 x 2.1 x 2.7 cm , thyroid biopsy, Dr. Gerrit Friends 05/2023 FNA biopsy shows atypia.  Will be sent for molecular genetic testing with [facility] . Patient reports normal AFFIRMA testing ( unable to see report in Epic)  She is due for follow-up Dr. Gerrit Friends and repeat thyroid ultrasound.  Discussed this today after review of CT chest.  She will call Dr. Ardine Eng office

## 2024-03-14 NOTE — Assessment & Plan Note (Signed)
 I have re prescribed Wegovy to see if covered by new carrier.  Counseled on blackbox warning, side effects and titration.  Congratulated patient on diligence to exercise

## 2024-03-14 NOTE — Assessment & Plan Note (Signed)
 Counseled on lifestyle modification to thwart progression.  Pending labs.

## 2024-03-15 LAB — TSH: TSH: 0.22 u[IU]/mL — ABNORMAL LOW (ref 0.35–5.50)

## 2024-03-15 LAB — VITAMIN D 25 HYDROXY (VIT D DEFICIENCY, FRACTURES): VITD: 23.18 ng/mL — ABNORMAL LOW (ref 30.00–100.00)

## 2024-03-15 LAB — CYTOLOGY - PAP
Comment: NEGATIVE
Diagnosis: NEGATIVE
High risk HPV: NEGATIVE

## 2024-03-15 NOTE — Addendum Note (Signed)
 Addended by: [place], Omaira Mellen on: 03/15/2024 03:00 PM   Modules accepted: Orders

## 2024-03-16 LAB — HEMOGLOBIN A1C: Hgb A1c MFr Bld: 5.9 % (ref 4.6–6.5)

## 2024-03-20 ENCOUNTER — Other Ambulatory Visit: Payer: Self-pay | Admitting: Family

## 2024-03-20 ENCOUNTER — Encounter: Payer: Self-pay | Admitting: Family

## 2024-03-20 DIAGNOSIS — R899 Unspecified abnormal finding in specimens from other organs, systems and tissues: Secondary | ICD-10-CM

## 2024-03-25 ENCOUNTER — Ambulatory Visit
Admission: RE | Admit: 2024-03-25 | Discharge: 2024-03-25 | Disposition: A | Discharge status: Home / Self Care | Source: Ambulatory Visit | Attending: Emergency Medicine | Admitting: Emergency Medicine

## 2024-03-25 ENCOUNTER — Other Ambulatory Visit: Payer: Self-pay

## 2024-03-25 VITALS — BP 143/94 | HR 109 | Temp 99.6°F | Resp 18

## 2024-03-25 DIAGNOSIS — J069 Acute upper respiratory infection, unspecified: Secondary | ICD-10-CM | POA: Diagnosis not present

## 2024-03-25 LAB — POC COVID19/FLU A&B COMBO
Covid Antigen, POC: NEGATIVE
Influenza A Antigen, POC: NEGATIVE
Influenza B Antigen, POC: NEGATIVE

## 2024-03-25 LAB — POCT RAPID STREP A (OFFICE): Rapid Strep A Screen: NEGATIVE

## 2024-03-25 NOTE — ED Provider Notes (Addendum)
 Jodi Hawkins    CSN: [id number] Arrival date & time: 03/25/24  1851      History   Chief Complaint Chief Complaint  Patient presents with   Cough    sore throat, ears hurt - Entered by patient   Sore Throat         HPI Jodi Hawkins is a 53 y.o. female.   Patient presents for evaluation of chills, generalized bodyaches, postnasal drip, sore throat and intermittent headaches present for 2 days, began to experience a nonproductive cough today.  Has attempted use of Zyrtec.  No known sick contacts.  Poor appetite but able to tolerate some food and liquids.  Denies shortness of breath and wheezing  Past Medical History:  Diagnosis Date   Anxiety    COVID-19    09/2020   Diabetes mellitus without complication (HCC)    Migraines     Patient Active Problem List   Diagnosis Date Noted   Renal lesion 03/14/2024   Hepatic steatosis 04/07/2023   Thyroid nodule 03/29/2023   Left renal mass 03/29/2023   Adenomatous polyp of colon 02/09/2023   Fatigue 01/11/2023   B12 deficiency 08/11/2022   BMI 35.0-35.9,adult 08/10/2022   Chronic pain of right knee 08/10/2022   Low back pain 08/10/2022   Urinary incontinence 08/10/2022   Hyperlipidemia 08/10/2022   Prediabetes 08/10/2022   Headache 01/15/2020   Sacral pain 01/15/2020   Dizziness 09/12/2017   Acute dysfunction of left eustachian tube 11/01/2016   OME (otitis media with effusion) 11/22/2015   Vitamin D deficiency 11/22/2015   Annual physical exam 10/23/2015   Generalized anxiety disorder 09/11/2015   Right knee pain 09/11/2015   Insomnia 09/11/2015    Past Surgical History:  Procedure Laterality Date   CARPAL TUNNEL RELEASE     COLONOSCOPY WITH PROPOFOL N/A 02/09/2023   Procedure: COLONOSCOPY WITH PROPOFOL;  Surgeon: Wyline Mood, MD;  Location[facility] ENDOSCOPY;  Service: Gastroenterology;  Laterality: N/A;    OB History   No obstetric history on file.      Home Medications    Prior to Admission  medications   Medication Sig Start Date End Date Taking? Authorizing Provider  DULoxetine (CYMBALTA) 30 MG capsule TAKE 1 CAPSULE BY MOUTH EVERY DAY 03/12/24   Allegra Grana, FNP  fluticasone (FLONASE) 50 MCG/ACT nasal spray Place 2 sprays into both nostrils daily. 12/25/23   Viviano Simas, FNP  meloxicam (MOBIC) 7.5 MG tablet TAKE 1 TABLET(7.5 MG) BY MOUTH DAILY AS NEEDED FOR PAIN 03/11/24   Allegra Grana, FNP  Semaglutide-Weight Management (WEGOVY) 0.25 MG/0.5ML SOAJ Inject 0.25 mg into the skin once a week. 03/14/24   Allegra Grana, FNP  traZODone (DESYREL) 100 MG tablet TAKE 1 TABLET BY MOUTH EVERYDAY AT BEDTIME 02/15/24   Allegra Grana, FNP    Family History Family History  Problem Relation Age of Onset   Hypertension Mother    Thyroid disease Sister        thinks hypothyroid   Hypertension Paternal Grandmother    Hypertension Paternal Grandfather    Breast cancer Neg Hx    Thyroid cancer Neg Hx     Social History Social History   Tobacco Use   Smoking status: Former    Current packs/day: 0.00    Average packs/day: 1 pack/day for 31.0 years (31.0 ttl pk-yrs)    Types: Cigarettes    Start date: 06/18/1981    Quit date: 06/18/2012    Years since quitting: 11.7  Smokeless tobacco: Never  Vaping Use   Vaping status: Never Used  Substance Use Topics   Alcohol use: No    Alcohol/week: 0.0 standard drinks of alcohol   Drug use: No     Allergies   Patient has no known allergies.   Review of Systems Review of Systems   Physical Exam Triage Vital Signs ED Triage Vitals [03/25/24 1910]  Encounter Vitals Group     BP (!) 143/94     Systolic BP Percentile      Diastolic BP Percentile      Pulse Rate (!) 109     Resp 18     Temp 99.6 F (37.6 C)     Temp Source Oral     SpO2 94 %     Weight      Height      Head Circumference      Peak Flow      Pain Score 5     Pain Loc      Pain Education      Exclude from Growth Chart    No data  found.  Updated Vital Signs BP (!) 143/94 (BP Location: Left Arm)   Pulse (!) 109   Temp 99.6 F (37.6 C) (Oral)   Resp 18   SpO2 94%   Visual Acuity Right Eye Distance:   Left Eye Distance:   Bilateral Distance:    Right Eye Near:   Left Eye Near:    Bilateral Near:     Physical Exam Constitutional:      Appearance: Normal appearance.  HENT:     Head: Normocephalic.     Right Ear: Tympanic membrane, ear canal and external ear normal.     Left Ear: Tympanic membrane, ear canal and external ear normal.     Nose: Congestion present.     Mouth/Throat:     Mouth: Mucous membranes are moist.     Pharynx: Oropharynx is clear.  Eyes:     Extraocular Movements: Extraocular movements intact.  Cardiovascular:     Rate and Rhythm: Normal rate and regular rhythm.     Pulses: Normal pulses.     Heart sounds: Normal heart sounds.  Pulmonary:     Effort: Pulmonary effort is normal.     Breath sounds: Normal breath sounds.  Musculoskeletal:     Cervical back: Normal range of motion and neck supple.  Neurological:     Mental Status: She is alert and oriented to person, place, and time. Mental status is at baseline.      UC Treatments / Results  Labs (all labs ordered are listed, but only abnormal results are displayed) Labs Reviewed  POC COVID19/FLU A&B COMBO  POCT RAPID STREP A (OFFICE)    EKG   Radiology No results found.  Procedures Procedures (including critical care time)  Medications Ordered in UC Medications - No data to display  Initial Impression / Assessment and Plan / UC Course  I have reviewed the triage vital signs and the nursing notes.  Pertinent labs & imaging results that were available during my care of the patient were reviewed by me and considered in my medical decision making (see chart for details).  Viral URI with cough   Patient is in no signs of distress nor toxic appearing.  Vital signs are stable.  Low suspicion for pneumonia,  pneumothorax or bronchitis and therefore will defer imaging. Covid, fl\u and strep negative .May use additional over-the-counter medications as needed for  supportive care.  May follow-up with urgent care as needed if symptoms persist or worsen.  Note given.   Final Clinical Impressions(s) / UC Diagnoses   Final diagnoses:  None   Discharge Instructions   None    ED Prescriptions   None    PDMP not reviewed this encounter.   Valinda Hoar, NP 03/25/24 1933    Valinda Hoar, NP 03/25/24 [phone]

## 2024-03-25 NOTE — Discharge Instructions (Signed)
 Your symptoms today are most likely being caused by a virus and should steadily improve in time it can take up to 7 to 10 days before you truly start to see a turnaround however things will get better  Covid, strep And flu test negative    You can take Tylenol and/or Ibuprofen as needed for fever reduction and pain relief.   For cough: honey 1/2 to 1 teaspoon (you can dilute the honey in water or another fluid).  You can also use guaifenesin and dextromethorphan for cough. You can use a humidifier for chest congestion and cough.  If you don't have a humidifier, you can sit in the bathroom with the hot shower running.      For sore throat: try warm salt water gargles, cepacol lozenges, throat spray, warm tea or water with lemon/honey, popsicles or ice, or OTC cold relief medicine for throat discomfort.   For congestion: take a daily anti-histamine like Zyrtec, Claritin, and a oral decongestant, such as pseudoephedrine.  You can also use Flonase 1-2 sprays in each nostril daily.   It is important to stay hydrated: drink plenty of fluids (water, gatorade/powerade/pedialyte, juices, or teas) to keep your throat moisturized and help further relieve irritation/discomfort.

## 2024-03-25 NOTE — ED Triage Notes (Signed)
 Patient presents to [facility] for evaluation of body aches, sore throat, bilateral ear pain, headache, chills, cough since Saturday.  Has been trying Zyrtec without relief

## 2024-04-01 ENCOUNTER — Ambulatory Visit

## 2024-04-03 ENCOUNTER — Ambulatory Visit

## 2024-04-03 ENCOUNTER — Ambulatory Visit
Admission: RE | Admit: 2024-04-03 | Discharge: 2024-04-03 | Disposition: A | Discharge status: Home / Self Care | Source: Ambulatory Visit | Attending: Family | Admitting: Family

## 2024-04-03 DIAGNOSIS — Z1231 Encounter for screening mammogram for malignant neoplasm of breast: Secondary | ICD-10-CM | POA: Diagnosis not present

## 2024-04-03 DIAGNOSIS — E538 Deficiency of other specified B group vitamins: Secondary | ICD-10-CM | POA: Diagnosis not present

## 2024-04-03 DIAGNOSIS — Z0000 Encounter for general adult medical examination without abnormal findings: Secondary | ICD-10-CM

## 2024-04-03 MED ORDER — CYANOCOBALAMIN 1000 MCG/ML IJ SOLN
1000.0000 ug | Freq: Once | INTRAMUSCULAR | Status: AC
  Administered 2024-04-03: 1000 ug via INTRAMUSCULAR

## 2024-04-03 NOTE — Progress Notes (Signed)
 Patient presented for B 12 injection to left deltoid, patient voiced no concerns nor showed any signs of distress during injection.

## 2024-04-15 ENCOUNTER — Other Ambulatory Visit: Payer: Self-pay | Admitting: Acute Care

## 2024-04-15 DIAGNOSIS — Z87891 Personal history of nicotine dependence: Secondary | ICD-10-CM

## 2024-04-15 DIAGNOSIS — Z122 Encounter for screening for malignant neoplasm of respiratory organs: Secondary | ICD-10-CM

## 2024-04-21 ENCOUNTER — Encounter: Payer: Self-pay | Admitting: Family

## 2024-04-26 ENCOUNTER — Encounter (HOSPITAL_COMMUNITY): Payer: Self-pay

## 2024-05-03 ENCOUNTER — Telehealth: Payer: Self-pay | Admitting: Family

## 2024-05-03 ENCOUNTER — Ambulatory Visit

## 2024-05-03 DIAGNOSIS — E538 Deficiency of other specified B group vitamins: Secondary | ICD-10-CM | POA: Diagnosis not present

## 2024-05-03 MED ORDER — CYANOCOBALAMIN 1000 MCG/ML IJ SOLN
1000.0000 ug | Freq: Once | INTRAMUSCULAR | Status: AC
Start: 2024-05-03 — End: 2024-05-03
  Administered 2024-05-03: 1000 ug via INTRAMUSCULAR

## 2024-05-03 NOTE — Telephone Encounter (Signed)
 Pt came in inquiring about a different medication to try Mounjaro or Ozempic due to her insurance will not cover the wegovy .

## 2024-05-03 NOTE — Progress Notes (Signed)
 Patient presented for B 12 injection to left deltoid, patient voiced no concerns nor showed any signs of distress during injection.

## 2024-05-05 ENCOUNTER — Encounter: Payer: Self-pay | Admitting: Family

## 2024-05-06 ENCOUNTER — Ambulatory Visit: Admitting: Internal Medicine

## 2024-05-06 ENCOUNTER — Encounter: Payer: Self-pay | Admitting: Internal Medicine

## 2024-05-06 VITALS — BP 118/74 | HR 111 | Temp 98.2°F | Ht 62.0 in | Wt 196.2 lb

## 2024-05-06 DIAGNOSIS — G43709 Chronic migraine without aura, not intractable, without status migrainosus: Secondary | ICD-10-CM | POA: Diagnosis not present

## 2024-05-06 MED ORDER — SUMATRIPTAN SUCCINATE 50 MG PO TABS: 50.0000 mg | ORAL_TABLET | ORAL | 0 refills | Status: AC | PRN

## 2024-05-06 MED ORDER — PROPRANOLOL HCL 40 MG PO TABS: 40.0000 mg | ORAL_TABLET | Freq: Two times a day (BID) | ORAL | 1 refills | Status: AC

## 2024-05-06 NOTE — Telephone Encounter (Signed)
 Sent mychart message; pt has read

## 2024-05-06 NOTE — Progress Notes (Signed)
 Acute Office Visit  Subjective:     Patient ID: Jodi Hawkins, female    DOB: 1971-09-26, 53 y.o.   MRN: [id number]  Chief Complaint  Patient presents with   Acute Visit    Migraines one every 3-4 weeks Vomits for 4 hours when she has a migraine    HPI Patient is in today for follow-up of acute migraine episode.  Patient states that proximately 1 week ago she developed a migraine headache associated with nausea and vomiting as well as photophobia which took approximately 4 hours to resolve.  Patient states that she took a 10 mg ibuprofen for this.  Patient states that she has had a mild constant headache since then over the back of her head but no photophobia or nausea/vomiting since then.  She states that she has had recurrent migraines for years but has never been worked up for this.  She states the migraines used to come every few months but over the last 3 to 4 months she has been having them more constantly.  The headaches do not have an aura but are associated with photophobia and nausea and vomiting.  Usually over the back of her head.  No visual changes noted.  No focal neurological deficits noted.  No fevers or chills.  They do not wake her up from sleep.  Review of Systems  Constitutional: Negative.   HENT: Negative.    Respiratory: Negative.    Cardiovascular: Negative.   Gastrointestinal:  Positive for nausea and vomiting.  Musculoskeletal: Negative.   Neurological:  Positive for headaches.  Psychiatric/Behavioral: Negative.          Objective:    BP 118/74   Pulse (!) 111   Temp 98.2 F (36.8 C)   Ht 5\' 2"  (1.575 m)   Wt 196 lb 3.2 oz (89 kg)   SpO2 96%   BMI 35.89 kg/m    Physical Exam Constitutional:      Appearance: Normal appearance.  HENT:     Head: Normocephalic and atraumatic.  Eyes:     Extraocular Movements: Extraocular movements intact.  Cardiovascular:     Rate and Rhythm: Normal rate and regular rhythm.     Heart sounds: Normal heart  sounds.  Pulmonary:     Effort: Pulmonary effort is normal.     Breath sounds: Normal breath sounds. No wheezing or rales.  Neurological:     Mental Status: She is alert and oriented to person, place, and time.     Comments: Power 5 out of 5 in bilateral upper and lower extremities Sensation intact in bilateral upper and lower extremities and face Tongue is central, SCM strength intact.  EOM intact.  Psychiatric:        Mood and Affect: Mood normal.        Behavior: Behavior normal.     No results found for any visits on 05/06/24.      Assessment & Plan:   Problem List Items Addressed This Visit       Cardiovascular and Mediastinum   Migraines - Primary   - Patient states that she had an acute migraine approximately 1 week ago associated nausea and vomiting and photophobia which took approximately 4 hours to resolve with the help of ibuprofen 800 mg -However, patient states that her headaches have been increasing in frequency over the last 3 to 4 months and that she has had a somewhat constant mild occipital headache since her last migraine a week ago -On  exam today, patient has no focal neurological deficits -Patient started on triptan to be used if ibuprofen is ineffective for any acute migraine.  She was instructed that she should only take a maximum of 2 pills in a 24-hour period -Will also put in a referral to neurology because she states that she has never had her workup for this headache.  She has no auras either and it is uncertain if these headaches are definitively migraines even though they appear to be.  I suspect she will need further imaging but will defer to neurology at this time -I have also started the patient on migraine prophylaxis with propranolol  40 mg twice daily -Return precautions and the patient -No further workup at this time      Relevant Medications   SUMAtriptan  (IMITREX ) 50 MG tablet   propranolol  (INDERAL ) 40 MG tablet   Other Relevant Orders    Ambulatory referral to Neurology    Meds ordered this encounter  Medications   SUMAtriptan  (IMITREX ) 50 MG tablet    Sig: Take 1 tablet (50 mg total) by mouth every 2 (two) hours as needed for migraine. May repeat in 2 hours if headache persists or recurs.  Do not exceed 2 pills in a 24-hour period.    Dispense:  10 tablet    Refill:  0   propranolol  (INDERAL ) 40 MG tablet    Sig: Take 1 tablet (40 mg total) by mouth 2 (two) times daily.    Dispense:  60 tablet    Refill:  1    No follow-ups on file.  Aliese Brannum, MD

## 2024-05-06 NOTE — Assessment & Plan Note (Signed)
-   Patient states that she had an acute migraine approximately 1 week ago associated nausea and vomiting and photophobia which took approximately 4 hours to resolve with the help of ibuprofen 800 mg -However, patient states that her headaches have been increasing in frequency over the last 3 to 4 months and that she has had a somewhat constant mild occipital headache since her last migraine a week ago -On exam today, patient has no focal neurological deficits -Patient started on triptan to be used if ibuprofen is ineffective for any acute migraine.  She was instructed that she should only take a maximum of 2 pills in a 24-hour period -Will also put in a referral to neurology because she states that she has never had her workup for this headache.  She has no auras either and it is uncertain if these headaches are definitively migraines even though they appear to be.  I suspect she will need further imaging but will defer to neurology at this time -I have also started the patient on migraine prophylaxis with propranolol  40 mg twice daily -Return precautions and the patient -No further workup at this time

## 2024-05-06 NOTE — Patient Instructions (Signed)
-   It was a pleasure meeting you today -I do suspect that your headaches are likely secondary to migraines.  However, I do want you to follow-up with a neurologist for further evaluation -I have prescribed sumatriptan  for you to help with your acute episode of migraine (to be used if ibuprofen is not effective) -I have also prescribed propranolol  to help with migraine prophylaxis.  We may need to titrate this dose up to help with the headaches -Please follow-up with Daivd Dub as well as your neurologist for further evaluation of your migraines -Please call us  if you have any questions or concerns or if you develop new symptoms

## 2024-05-09 ENCOUNTER — Telehealth: Payer: Self-pay

## 2024-05-09 NOTE — Telephone Encounter (Signed)
 Copied from CRM [id number]. Topic: Clinical - Medication Prior Auth >> May 09, 2024  1:08 PM Jodi Hawkins wrote: Reason for CRM: Would like to know if Jodi Hawkins has received pre-auth to see it and fill it out and also would like to know if she could have a prescription of Wegovy  for a week until pre-auth has been completed. Please call [phone].

## 2024-05-14 ENCOUNTER — Other Ambulatory Visit (HOSPITAL_COMMUNITY): Payer: Self-pay

## 2024-05-14 ENCOUNTER — Telehealth: Payer: Self-pay

## 2024-05-14 NOTE — Telephone Encounter (Signed)
 Pharmacy Patient Advocate Encounter   Received notification from CoverMyMeds that prior authorization for Wegovy  0.25MG /0.5ML auto-injectors  is required/requested.   Insurance verification completed.   The patient is insured through [employer] .   Per test claim: PA required; PA submitted to above mentioned insurance via CoverMyMeds Key/confirmation #/EOC [id number] Status is pending

## 2024-05-15 ENCOUNTER — Other Ambulatory Visit: Payer: Self-pay

## 2024-05-15 DIAGNOSIS — Z0000 Encounter for general adult medical examination without abnormal findings: Secondary | ICD-10-CM

## 2024-05-15 DIAGNOSIS — Z6835 Body mass index (BMI) 35.0-35.9, adult: Secondary | ICD-10-CM

## 2024-05-15 MED ORDER — WEGOVY 0.25 MG/0.5ML ~~LOC~~ SOAJ: 0.2500 mg | SUBCUTANEOUS | 2 refills | Status: DC

## 2024-05-15 NOTE — Telephone Encounter (Signed)
 Called Patient and let her know that the Wegovy  has been approved from 04/14/24 through 12/10/24.Sent Wegovy  to [employer] at [facility].

## 2024-05-15 NOTE — Telephone Encounter (Signed)
 Pharmacy Patient Advocate Encounter   Received notification from [employer] that Prior Authorization for Wegovy  0.25MG /0.5ML auto-injectors has been APPROVED from 04/14/2024 to 12/23/2025SEE PLAN MESSAGE BELOW Message from plan: CaseId:[id number];Status:Approved;Review Type:Prior Auth;Coverage Start Date:04/14/2024;Coverage End Date:12/10/2024;. Authorization Expiration Date: December 10, 2024.

## 2024-05-28 ENCOUNTER — Other Ambulatory Visit (HOSPITAL_COMMUNITY): Payer: Self-pay

## 2024-06-03 ENCOUNTER — Ambulatory Visit

## 2024-06-05 ENCOUNTER — Ambulatory Visit (INDEPENDENT_AMBULATORY_CARE_PROVIDER_SITE_OTHER)

## 2024-06-05 DIAGNOSIS — E538 Deficiency of other specified B group vitamins: Secondary | ICD-10-CM | POA: Diagnosis not present

## 2024-06-05 MED ORDER — CYANOCOBALAMIN 1000 MCG/ML IJ SOLN
1000.0000 ug | Freq: Once | INTRAMUSCULAR | Status: AC
  Administered 2024-06-05: 1000 ug via INTRAMUSCULAR

## 2024-06-05 NOTE — Progress Notes (Signed)
 Patient is in office today for a nurse visit for B12 Injection. Patient Injection was given in the  Right deltoid. Patient tolerated injection well.

## 2024-07-01 ENCOUNTER — Encounter: Payer: Self-pay | Admitting: Family

## 2024-07-01 MED ORDER — WEGOVY 0.5 MG/0.5ML ~~LOC~~ SOAJ: 0.5000 mg | SUBCUTANEOUS | 1 refills | Status: DC

## 2024-07-23 ENCOUNTER — Ambulatory Visit (INDEPENDENT_AMBULATORY_CARE_PROVIDER_SITE_OTHER): Admitting: *Deleted

## 2024-07-23 DIAGNOSIS — E538 Deficiency of other specified B group vitamins: Secondary | ICD-10-CM

## 2024-07-23 MED ORDER — CYANOCOBALAMIN 1000 MCG/ML IJ SOLN
1000.0000 ug | Freq: Once | INTRAMUSCULAR | Status: AC
Start: 2024-07-23 — End: 2024-07-23
  Administered 2024-07-23: 1000 ug via INTRAMUSCULAR

## 2024-07-23 NOTE — Progress Notes (Signed)
Pt received B12 injection in left deltoid muscle. Pt tolerated it well with no complaints or concerns.  

## 2024-08-23 ENCOUNTER — Ambulatory Visit

## 2024-08-23 DIAGNOSIS — E538 Deficiency of other specified B group vitamins: Secondary | ICD-10-CM | POA: Diagnosis not present

## 2024-08-23 MED ORDER — CYANOCOBALAMIN 1000 MCG/ML IJ SOLN
1000.0000 ug | Freq: Once | INTRAMUSCULAR | Status: AC
  Administered 2024-08-23: 1000 ug via INTRAMUSCULAR

## 2024-08-23 NOTE — Progress Notes (Signed)
Pt received B12 injection in right deltoid. Pt tolerated it well with no complaints or concerns. 

## 2024-09-02 ENCOUNTER — Encounter: Payer: Self-pay | Admitting: Family

## 2024-09-04 MED ORDER — SEMAGLUTIDE-WEIGHT MANAGEMENT 1 MG/0.5ML ~~LOC~~ SOAJ: 1.0000 mg | SUBCUTANEOUS | 0 refills | Status: DC

## 2024-09-07 ENCOUNTER — Other Ambulatory Visit: Payer: Self-pay | Admitting: Family

## 2024-09-07 DIAGNOSIS — F411 Generalized anxiety disorder: Secondary | ICD-10-CM

## 2024-09-07 DIAGNOSIS — M545 Low back pain, unspecified: Secondary | ICD-10-CM

## 2024-09-23 ENCOUNTER — Ambulatory Visit

## 2024-10-04 ENCOUNTER — Ambulatory Visit

## 2024-10-07 ENCOUNTER — Other Ambulatory Visit: Payer: Self-pay | Admitting: Family

## 2024-10-17 ENCOUNTER — Telehealth: Payer: Self-pay

## 2024-10-17 DIAGNOSIS — E538 Deficiency of other specified B group vitamins: Secondary | ICD-10-CM

## 2024-10-17 NOTE — Telephone Encounter (Signed)
 Copied from CRM #[id number]. Topic: Clinical - Request for Lab/Test Order >> Oct 17, 2024 10:17 AM Charolett L wrote: Reason for CRM: Patient requesting orders for a B-12 shot

## 2024-10-18 ENCOUNTER — Ambulatory Visit (INDEPENDENT_AMBULATORY_CARE_PROVIDER_SITE_OTHER)

## 2024-10-18 DIAGNOSIS — E559 Vitamin D deficiency, unspecified: Secondary | ICD-10-CM | POA: Diagnosis not present

## 2024-10-18 MED ORDER — CYANOCOBALAMIN 1000 MCG/ML IJ SOLN
1000.0000 ug | Freq: Once | INTRAMUSCULAR | Status: AC
  Administered 2024-10-18: 1000 ug via INTRAMUSCULAR

## 2024-10-18 NOTE — Progress Notes (Signed)
 Pt received B12 injection in Left deltoid muscle. Pt tolerated it well with no complaints or concerns. Pt stated she will call to make an appointment for her next B12 shot

## 2024-10-22 NOTE — Addendum Note (Signed)
 Addended by: Miki Labuda on: 10/22/2024 02:24 PM   Modules accepted: Orders

## 2024-10-22 NOTE — Telephone Encounter (Signed)
 Spoke to pt she states that she will call before her next b12 injection and schedule appt for recheck of b12 lab has been ordered

## 2024-11-03 ENCOUNTER — Other Ambulatory Visit: Payer: Self-pay | Admitting: Family

## 2024-11-20 ENCOUNTER — Other Ambulatory Visit (HOSPITAL_COMMUNITY): Payer: Self-pay

## 2024-12-03 ENCOUNTER — Other Ambulatory Visit: Payer: Self-pay | Admitting: Family

## 2024-12-04 ENCOUNTER — Other Ambulatory Visit (HOSPITAL_COMMUNITY): Payer: Self-pay

## 2024-12-05 ENCOUNTER — Other Ambulatory Visit (HOSPITAL_COMMUNITY): Payer: Self-pay

## 2024-12-06 ENCOUNTER — Other Ambulatory Visit: Payer: Self-pay | Admitting: Family

## 2024-12-06 ENCOUNTER — Telehealth: Payer: Self-pay

## 2024-12-06 NOTE — Telephone Encounter (Signed)
 Copied from CRM #[id number]. Topic: Clinical - Medication Refill >> Dec 06, 2024 11:28 AM Tanazia G wrote: Medication: semaglutide -weight management (WEGOVY ) 1 MG/0.5ML SOAJ SQ injection  Has the patient contacted their pharmacy? Yes (Agent: If no, request that the patient contact the pharmacy for the refill. If patient does not wish to contact the pharmacy document the reason why and proceed with request.) (Agent: If yes, when and what did the pharmacy advise?)  This is the patient's preferred pharmacy:  [id number] [place], [place] - [address] AT [facility] OF [address] [place] [address] Phone: [phone] Fax: [phone]  Is this the correct pharmacy for this prescription? Yes If no, delete pharmacy and type the correct one.   Has the prescription been filled recently? Yes  Is the patient out of the medication? Yes  Has the patient been seen for an appointment in the last year OR does the patient have an upcoming appointment? Yes  Can we respond through MyChart? Yes  Agent: Please be advised that Rx refills may take up to 3 business days. We ask that you follow-up with your pharmacy.

## 2024-12-06 NOTE — Telephone Encounter (Signed)
 LVM to inform pt that lab orders are in she can schedule at her convienence

## 2024-12-06 NOTE — Telephone Encounter (Signed)
 Copied from CRM [id number]. Topic: Clinical - Lab/Test Results >> Dec 06, 2024 11:31 AM Mercedes MATSU wrote: Reason for CRM: Patient called in requesting to get an b-12 lab order. She states she would like to be called once the order is placed so she can schedule her a lab appointment. I offered patient a office visit with NP arnett and she declined and said she just needed her labs. Patient can be reached at [phone].

## 2024-12-09 NOTE — Telephone Encounter (Signed)
 Pt has scheduled lab appt for 12/10/24

## 2024-12-09 NOTE — Telephone Encounter (Signed)
 LVM  2nd time to inform pt that lab orders are in she can schedule at her convienence

## 2024-12-10 ENCOUNTER — Other Ambulatory Visit (INDEPENDENT_AMBULATORY_CARE_PROVIDER_SITE_OTHER)

## 2024-12-10 DIAGNOSIS — E538 Deficiency of other specified B group vitamins: Secondary | ICD-10-CM | POA: Diagnosis not present

## 2024-12-10 LAB — B12 AND FOLATE PANEL
Folate: 11 ng/mL
Vitamin B-12: 463 pg/mL (ref 211–911)

## 2024-12-11 ENCOUNTER — Other Ambulatory Visit (HOSPITAL_COMMUNITY): Payer: Self-pay

## 2024-12-11 ENCOUNTER — Telehealth: Payer: Self-pay

## 2024-12-11 NOTE — Telephone Encounter (Signed)
 Pharmacy Patient Advocate Encounter   Received notification from Onbase that prior authorization for semaglutide -weight management (WEGOVY ) 1 MG/0.5ML SOAJ SQ injection  is required/requested.   Insurance verification completed.   The patient is insured through [employer].   Per test claim: PA required; PA submitted to above mentioned insurance via Latent Key/confirmation #/EOC [facility] Status is pending

## 2024-12-12 ENCOUNTER — Encounter: Payer: Self-pay | Admitting: Family

## 2024-12-13 MED ORDER — WEGOVY 1 MG/0.5ML ~~LOC~~ SOAJ: 1.0000 mg | SUBCUTANEOUS | 1 refills | Status: DC

## 2024-12-16 NOTE — Telephone Encounter (Signed)
 Copied from CRM [id number]. Topic: Clinical - Medication Prior Auth >> Dec 16, 2024 12:04 PM Jodi Hawkins wrote: Reason for CRM: Patient called in stating questions for PA from [employer] was sent to PCP and is needed to be submitted back in order for her to receive medication Wegovy . Would like for someone to give a call and confirm if FNP Dineen has received them. Please call. >> Dec 16, 2024  1:53 PM [name] wrote: Patient is returning a call from [place].

## 2024-12-16 NOTE — Telephone Encounter (Signed)
 Return call to patient to follow up and patient thought she may have missed a call from our office. I notified I did not see where anyone had reached back out to her. Patient was advised that once our office received an update, that someone would give her an update. Patient verbalized understanding.

## 2024-12-16 NOTE — Telephone Encounter (Signed)
 Return call to patient to follow up. Patient states she was informed by [employer] that in order to cover the medication. There needed to be some questions answered for [place] (spell check). Please advise?

## 2024-12-16 NOTE — Telephone Encounter (Signed)
 Copied from CRM [id number]. Topic: Clinical - Medication Prior Auth >> Dec 16, 2024 12:04 PM Deaijah H wrote: Reason for CRM: Patient called in stating questions for PA from [employer] was sent to PCP and is needed to be submitted back in order for her to receive medication Wegovy . Would like for someone to give a call and confirm if FNP Dineen has received them. Please call.

## 2024-12-17 ENCOUNTER — Other Ambulatory Visit (HOSPITAL_COMMUNITY): Payer: Self-pay

## 2024-12-17 ENCOUNTER — Ambulatory Visit: Payer: Self-pay

## 2024-12-17 ENCOUNTER — Ambulatory Visit: Payer: Self-pay | Admitting: Family

## 2024-12-17 NOTE — Telephone Encounter (Signed)
 Prior Authorization form/request asks a question that requires your assistance. Please see the question below and advise accordingly. The PA will not be submitted until the necessary information is received.   Please document the patient's CURRENT or MOST RECENT height, weight, and body mass index (BMI). Pt must been seen and have had a weight and BMI check  with in 45 day. Most currernt weight and BMI check was 05/06/2024

## 2024-12-17 NOTE — Telephone Encounter (Signed)
 Pt has been scheduled for 12/24/24 to get PA requirements met for Wegovy 

## 2024-12-17 NOTE — Telephone Encounter (Signed)
 FYI Only or Action Required?: FYI only for provider: ED advised and will go if changes.  Patient was last seen in primary care on 05/06/2024 by Onesimo Claude, MD.  Called Nurse Triage reporting Chest Pain.  Symptoms began today.  Interventions attempted: OTC medications: ibuprofen.  Symptoms are: unchanged.  Triage Disposition: See Physician Within 24 Hours  Patient/caregiver understands and will follow disposition?: No, wishes to speak with PCP   Copied from CRM #[id number]. Topic: Clinical - Red Word Triage >> Dec 17, 2024  5:43 PM Jayma L wrote: Red Word that prompted transfer to Nurse Triage: having chest pain today Reason for Disposition  [1] Chest pain lasts < 5 minutes AND [2] NO chest pain or cardiac symptoms (e.g., breathing difficulty, sweating) now  (Exception: Chest pains that last only a few seconds.)  Answer Assessment - Initial Assessment Questions Patient is highly stressed between the holidays and not being able to get her Wegovy  script for last 3 weeks and will be another 2-3weeks with appts and insurance until she can get it.  Asking about potential side effects for cold turkey Wegovy  stoppage. Advised on side effects to watch for.   She has had a constant ache in her right chest starting today. Like when you get a stitch in her side running- above her right breast closer to her armpit.  Might have slept on it wrong-  Denies weakness/paresthesias, SOB, CP, dizziness, fever, abdominal pain.  No heartburn type sensation- Denies referred pain. No new activity that could have strained it.   Headaches- taking way ibuprofen to relieve  Advised ED/UC for any changes or worsening in symptoms. Patient does not want to go sit in the ED. She will monitor and report back if unresolved.  1. LOCATION: Where does it hurt?       above her right breast closer to her armpit.  2. RADIATION: Does the pain go anywhere else? (e.g., into neck, jaw, arms, back)     Denies  3.  ONSET: When did the chest pain begin? (Minutes, hours or days)      Today  4. PATTERN: Does the pain come and go, or has it been constant since it started?  Does it get worse with exertion?      No fluctuation  5. DURATION: How long does it last (e.g., seconds, minutes, hours)     Constant  6. SEVERITY: How bad is the pain?  (e.g., Scale 1-10; mild, moderate, or severe)     2-3/10-more annoying 7. CARDIAC RISK FACTORS: Do you have any history of heart problems or risk factors for heart disease? (e.g., angina, prior heart attack; diabetes, high blood pressure, high cholesterol, smoker, or strong family history of heart disease)     Anxiety  8. PULMONARY RISK FACTORS: Do you have any history of lung disease?  (e.g., blood clots in lung, asthma, emphysema, birth control pills)     denies 9. CAUSE: What do you think is causing the chest pain?     Stress/anxiety, or withdrawal from Wegovy   10. OTHER SYMPTOMS: Do you have any other symptoms? (e.g., dizziness, nausea, vomiting, sweating, fever, difficulty breathing, cough)       Denies  Protocols used: Chest Pain-A-AH

## 2024-12-18 NOTE — Telephone Encounter (Signed)
 Discuss wegovy  with pt 12/24/24

## 2024-12-18 NOTE — Telephone Encounter (Signed)
 noted

## 2024-12-18 NOTE — Telephone Encounter (Signed)
 Patient states she is not having chest pain today and believes it was due to her prescription and all the back and forth but will go to the ED to be evaluated since that is what Rollene Northern is advising.

## 2024-12-18 NOTE — Telephone Encounter (Signed)
 Spoke to pt per Rollene advised ED for CP but she is not having any chest pains, per pt pt thgt it was a side effect of not having taken Wegovy  in 3 weeks, pt states that she is stressed out but she does NOT feel the need to go to ED she has appt with us  on 12/25/23

## 2024-12-18 NOTE — Telephone Encounter (Unsigned)
 Copied from CRM [id number]. Topic: Clinical - Medication Prior Auth >> Dec 17, 2024  5:25 PM [name] wrote: Reason for CRM: Neville with [facility] wants to know where, when, and who prior authorization for semaglutide -weight management (WEGOVY ) 1 MG/0.5ML SOAJ SQ injection was sent to. *Prior Auth can be sent through covermymeds or a verbal to [phone] Patient hasn't had medication in 3 week and is experiencing side effects.

## 2024-12-24 ENCOUNTER — Ambulatory Visit: Admitting: Family

## 2024-12-24 ENCOUNTER — Encounter: Payer: Self-pay | Admitting: Family

## 2024-12-24 VITALS — BP 124/80 | HR 98 | Temp 98.6°F | Ht 62.0 in | Wt 167.8 lb

## 2024-12-24 DIAGNOSIS — E538 Deficiency of other specified B group vitamins: Secondary | ICD-10-CM

## 2024-12-24 DIAGNOSIS — E041 Nontoxic single thyroid nodule: Secondary | ICD-10-CM

## 2024-12-24 DIAGNOSIS — Z13220 Encounter for screening for lipoid disorders: Secondary | ICD-10-CM

## 2024-12-24 DIAGNOSIS — Z0000 Encounter for general adult medical examination without abnormal findings: Secondary | ICD-10-CM

## 2024-12-24 DIAGNOSIS — G4700 Insomnia, unspecified: Secondary | ICD-10-CM | POA: Diagnosis not present

## 2024-12-24 DIAGNOSIS — F411 Generalized anxiety disorder: Secondary | ICD-10-CM

## 2024-12-24 DIAGNOSIS — K760 Fatty (change of) liver, not elsewhere classified: Secondary | ICD-10-CM | POA: Diagnosis not present

## 2024-12-24 DIAGNOSIS — E559 Vitamin D deficiency, unspecified: Secondary | ICD-10-CM | POA: Diagnosis not present

## 2024-12-24 DIAGNOSIS — Z6830 Body mass index (BMI) 30.0-30.9, adult: Secondary | ICD-10-CM | POA: Diagnosis not present

## 2024-12-24 DIAGNOSIS — R21 Rash and other nonspecific skin eruption: Secondary | ICD-10-CM | POA: Diagnosis not present

## 2024-12-24 DIAGNOSIS — G8929 Other chronic pain: Secondary | ICD-10-CM | POA: Diagnosis not present

## 2024-12-24 DIAGNOSIS — M545 Low back pain, unspecified: Secondary | ICD-10-CM | POA: Diagnosis not present

## 2024-12-24 DIAGNOSIS — R7303 Prediabetes: Secondary | ICD-10-CM

## 2024-12-24 DIAGNOSIS — L299 Pruritus, unspecified: Secondary | ICD-10-CM

## 2024-12-24 LAB — COMPREHENSIVE METABOLIC PANEL WITH GFR
ALT: 16 U/L (ref 3–35)
AST: 14 U/L (ref 5–37)
Albumin: 4.5 g/dL (ref 3.5–5.2)
Alkaline Phosphatase: 73 U/L (ref 39–117)
BUN: 17 mg/dL (ref 6–23)
CO2: 29 meq/L (ref 19–32)
Calcium: 10.1 mg/dL (ref 8.4–10.5)
Chloride: 104 meq/L (ref 96–112)
Creatinine, Ser: 0.86 mg/dL (ref 0.40–1.20)
GFR: 75.92 mL/min
Glucose, Bld: 112 mg/dL — ABNORMAL HIGH (ref 70–99)
Potassium: 4.6 meq/L (ref 3.5–5.1)
Sodium: 142 meq/L (ref 135–145)
Total Bilirubin: 0.3 mg/dL (ref 0.2–1.2)
Total Protein: 7 g/dL (ref 6.0–8.3)

## 2024-12-24 LAB — HEMOGLOBIN A1C: Hgb A1c MFr Bld: 5.5 % (ref 4.6–6.5)

## 2024-12-24 LAB — T4, FREE: Free T4: 0.69 ng/dL (ref 0.60–1.60)

## 2024-12-24 LAB — VITAMIN D 25 HYDROXY (VIT D DEFICIENCY, FRACTURES): VITD: 19.03 ng/mL — ABNORMAL LOW (ref 30.00–100.00)

## 2024-12-24 LAB — T3, FREE: T3, Free: 4 pg/mL (ref 2.3–4.2)

## 2024-12-24 LAB — TSH: TSH: 0.03 u[IU]/mL — ABNORMAL LOW (ref 0.35–5.50)

## 2024-12-24 MED ORDER — WEGOVY 1.7 MG/0.75ML ~~LOC~~ SOAJ: 1.7000 mg | SUBCUTANEOUS | 2 refills | Status: AC

## 2024-12-24 MED ORDER — MOMETASONE FUROATE 0.1 % EX CREA: 1.0000 | TOPICAL_CREAM | Freq: Every day | CUTANEOUS | 3 refills | Status: AC

## 2024-12-24 NOTE — Assessment & Plan Note (Addendum)
 Congratulated patient on weight loss, approximately 30 pounds.  Per PA for Wegovy , CURRENT or MOST RECENT height, weight, and body mass index (BMI). Discussed benefits of Wegovy  for her overall health and achievement of normal BMI.   History of prediabetes.  As weight loss has plateaued, increase Wegovy  1.7 mg

## 2024-12-24 NOTE — Assessment & Plan Note (Signed)
 Appearance of atopy. Trial of mometasone  cream.  Discussed skin thinning, hypopigmentation and not to use more than 7 days in a row.  If no resolution of symptoms, advised return to follow-up with dermatology.

## 2024-12-24 NOTE — Progress Notes (Signed)
 "  Assessment & Plan:  BMI 30.0-30.9,adult Assessment & Plan: Congratulated patient on weight loss, approximately 30 pounds.  Per PA for Wegovy , CURRENT or MOST RECENT height, weight, and body mass index (BMI). Discussed benefits of Wegovy  for her overall health and achievement of normal BMI.   History of prediabetes.  As weight loss has plateaued, increase Wegovy  1.7 mg  Orders: -     Hemoglobin A1c  Generalized anxiety disorder Assessment & Plan: Chronic, stable. Continue trazodone   100mg  at bedtime, Cymbalta  30 mg   Chronic midline low back pain without sciatica  Insomnia, unspecified type  B12 deficiency  Encounter for lipid screening for cardiovascular disease  Hepatic steatosis -     Comprehensive metabolic panel with GFR  Ear itching -     Mometasone  Furoate; Apply 1 Application topically daily. Appear pea sized ( or less) amount to external ear, and slightly in canal. Use less than 7 days.  Dispense: 15 g; Refill: 3  Thyroid  nodule Assessment & Plan: Pending repeat thyroid  studies; reviewed prior thyroid  biopsy.  Consider endocrine consult.   Orders: -     TSH -     Comprehensive metabolic panel with GFR -     Hemoglobin A1c -     T3, free -     T4, free -     Thyrotropin receptor autoabs  Rash Assessment & Plan: Appearance of atopy. Trial of mometasone  cream.  Discussed skin thinning, hypopigmentation and not to use more than 7 days in a row.  If no resolution of symptoms, advised return to follow-up with dermatology.   Vitamin D  deficiency -     VITAMIN D  25 Hydroxy (Vit-D Deficiency, Fractures)  Prediabetes -     Hemoglobin A1c  Other orders -     Wegovy ; Inject 1.7 mg into the skin once a week.  Dispense: 3 mL; Refill: 2     Return precautions given.   Risks, benefits, and alternatives of the medications and treatment plan prescribed today were discussed, and patient expressed understanding.   Education regarding symptom management and  diagnosis given to patient on AVS either electronically or printed.  Return in about 3 months (around 03/24/2025) for Complete Physical Exam.  Jodi Northern, FNP  Subjective:    Patient ID: Jodi Hawkins, female    DOB: 14-Dec-1971, 54 y.o.   MRN: [id number]  CC: Jodi Hawkins is a 54 y.o. female who presents today for follow up.   HPI: Feels well today.  She is been tolerating Wegovy  without constipation.  Every once a while she has a little bit of nausea which is self-limiting.  Denies vomiting   05/18/24 started wegovy  0.25mg  198.6 lbs     Goal weight is 155 lbs.   H/o prediabetes, DM    Continuing GLP agonist / GIP/GLP:   Denies family history medullary thyroid  cancer, multiple endocrine neoplasia. Denies personal history of pancreatitis, multiple endocrine neoplasia, chronic constipation. Eye exam is up-to-date.  She has new glasses.   No known history of retinopathy  Denies pain with swallowing, compressive sxs of neck  S/p thyroid  biopsy, Dr Eletha [facility] 05/23/23 testing benign, 4% risk of cancer US  thyroid  03/28/24  Nodule 1 (TI-RADS 4), measuring 4.0 x 2.1 x 2.7 cm, located in the inferior left thyroid  lobe, meets criteria for FNA.   She also complains of dry patches behind bilateral ears that are itchy.  She is using a topical CeraVe healing ointment.  Previously seen [place] [facility].  No  history of squamous or basal cell carcinoma  Allergies: Patient has no known allergies. Medications Ordered Prior to Encounter[1]  Review of Systems  Constitutional:  Negative for chills and fever.  Respiratory:  Negative for cough.   Cardiovascular:  Negative for chest pain and palpitations.  Gastrointestinal:  Negative for nausea and vomiting.  Skin:  Positive for rash.      Objective:    BP 124/80   Pulse 98   Temp 98.6 F (37 C)   Ht 5' 2 (1.575 m)   Wt 167 lb 12.8 oz (76.1 kg)   SpO2 97%   BMI 30.69 kg/m  BP Readings from Last 3 Encounters:   12/24/24 124/80  05/06/24 118/74  03/25/24 (!) 143/94   Wt Readings from Last 3 Encounters:  12/24/24 167 lb 12.8 oz (76.1 kg)  05/06/24 196 lb 3.2 oz (89 kg)  03/14/24 193 lb (87.5 kg)    Physical Exam Vitals reviewed.  Constitutional:      Appearance: She is well-developed.  Eyes:     Conjunctiva/sclera: Conjunctivae normal.  Cardiovascular:     Rate and Rhythm: Normal rate and regular rhythm.     Pulses: Normal pulses.     Heart sounds: Normal heart sounds.  Pulmonary:     Effort: Pulmonary effort is normal.     Breath sounds: Normal breath sounds. No wheezing, rhonchi or rales.  Skin:    General: Skin is warm and dry.     Comments: Dry flakey  patches behind bilateral ears.  Neurological:     Mental Status: She is alert.  Psychiatric:        Speech: Speech normal.        Behavior: Behavior normal.        Thought Content: Thought content normal.           [1]  Current Outpatient Medications on File Prior to Visit  Medication Sig Dispense Refill   DULoxetine  (CYMBALTA ) 30 MG capsule TAKE 1 CAPSULE BY MOUTH EVERY DAY 90 capsule 1   meloxicam  (MOBIC ) 7.5 MG tablet TAKE 1 TABLET(7.5 MG) BY MOUTH DAILY AS NEEDED FOR PAIN 30 tablet 1   propranolol  (INDERAL ) 40 MG tablet Take 1 tablet (40 mg total) by mouth 2 (two) times daily. 60 tablet 1   SUMAtriptan  (IMITREX ) 50 MG tablet Take 1 tablet (50 mg total) by mouth every 2 (two) hours as needed for migraine. May repeat in 2 hours if headache persists or recurs.  Do not exceed 2 pills in a 24-hour period. 10 tablet 0   traZODone  (DESYREL ) 100 MG tablet TAKE 1 TABLET BY MOUTH EVERYDAY AT BEDTIME 90 tablet 3   No current facility-administered medications on file prior to visit.   "

## 2024-12-24 NOTE — Assessment & Plan Note (Signed)
 Chronic, stable. Continue trazodone   100mg  at bedtime, Cymbalta  30 mg

## 2024-12-24 NOTE — Assessment & Plan Note (Signed)
 Pending repeat thyroid  studies; reviewed prior thyroid  biopsy.  Consider endocrine consult.

## 2024-12-25 LAB — THYROTROPIN RECEPTOR AUTOABS: Thyrotropin Receptor Ab: <1.1 IU/L (ref 0.00–1.75)

## 2025-01-01 ENCOUNTER — Telehealth: Payer: Self-pay

## 2025-01-01 ENCOUNTER — Other Ambulatory Visit (HOSPITAL_COMMUNITY): Payer: Self-pay

## 2025-01-01 NOTE — Telephone Encounter (Signed)
 Pharmacy Patient Advocate Encounter  Received notification from [facility] that Prior Authorization for Wegovy  1.7mg /0.81ml has been APPROVED from 12/19/24 to 01/01/26   PA #/Case ID/Reference #: [id number]

## 2025-01-01 NOTE — Telephone Encounter (Signed)
 Pt has been notified.

## 2025-01-01 NOTE — Telephone Encounter (Signed)
 Pharmacy Patient Advocate Encounter   Received notification from [facility] that prior authorization for Wegovy  1.7mg /0.1ml is required/requested.   Insurance verification completed.   The patient is insured through [employer].   Per test claim: PA required; PA submitted to above mentioned insurance via Latent Key/confirmation #/EOC [id number] Status is pending

## 2025-01-03 ENCOUNTER — Ambulatory Visit: Payer: Self-pay | Admitting: Family

## 2025-01-03 ENCOUNTER — Other Ambulatory Visit (HOSPITAL_COMMUNITY): Payer: Self-pay

## 2025-01-03 DIAGNOSIS — E041 Nontoxic single thyroid nodule: Secondary | ICD-10-CM

## 2025-01-03 DIAGNOSIS — E0590 Thyrotoxicosis, unspecified without thyrotoxic crisis or storm: Secondary | ICD-10-CM

## 2025-01-03 DIAGNOSIS — E559 Vitamin D deficiency, unspecified: Secondary | ICD-10-CM

## 2025-01-03 MED ORDER — CHOLECALCIFEROL 1.25 MG (50000 UT) PO TABS: ORAL_TABLET | ORAL | 0 refills | Status: AC

## 2025-01-13 ENCOUNTER — Telehealth: Payer: Self-pay | Admitting: Family

## 2025-01-13 NOTE — Telephone Encounter (Signed)
 Any status on referral to endocrine 01/03/2025?

## 2025-01-13 NOTE — Progress Notes (Signed)
 Left message for patient to give our office a call back to discuss recent lab results and recommendations per provider.    OK for E2C2 to give result note if patient calls back. If relayed, please notify the office.

## 2025-01-15 ENCOUNTER — Other Ambulatory Visit: Payer: Self-pay | Admitting: Family

## 2025-01-15 DIAGNOSIS — E0590 Thyrotoxicosis, unspecified without thyrotoxic crisis or storm: Secondary | ICD-10-CM

## 8387-08-20 DEATH — deceased
